# Patient Record
Sex: Female | Born: 1965 | Race: White | Hispanic: No | Marital: Married | Smoking: Current every day smoker
Health system: Southern US, Community
[De-identification: ages and names within clinical notes are randomized; demographics above are authoritative.]

## PROBLEM LIST (undated history)

## (undated) DIAGNOSIS — F1020 Alcohol dependence, uncomplicated: Secondary | ICD-10-CM

## (undated) DIAGNOSIS — D649 Anemia, unspecified: Secondary | ICD-10-CM

## (undated) DIAGNOSIS — F319 Bipolar disorder, unspecified: Secondary | ICD-10-CM

## (undated) DIAGNOSIS — N200 Calculus of kidney: Secondary | ICD-10-CM

## (undated) DIAGNOSIS — K5000 Crohn's disease of small intestine without complications: Secondary | ICD-10-CM

## (undated) DIAGNOSIS — E039 Hypothyroidism, unspecified: Secondary | ICD-10-CM

## (undated) DIAGNOSIS — S92902A Unspecified fracture of left foot, initial encounter for closed fracture: Secondary | ICD-10-CM

## (undated) DIAGNOSIS — F32A Depression, unspecified: Secondary | ICD-10-CM

## (undated) DIAGNOSIS — F329 Major depressive disorder, single episode, unspecified: Secondary | ICD-10-CM

## (undated) HISTORY — DX: Unspecified fracture of left foot, initial encounter for closed fracture: S92.902A

## (undated) HISTORY — DX: Alcohol dependence, uncomplicated: F10.20

## (undated) HISTORY — DX: Major depressive disorder, single episode, unspecified: F32.9

## (undated) HISTORY — DX: Depression, unspecified: F32.A

## (undated) HISTORY — PX: COLONOSCOPY: SHX174

## (undated) HISTORY — DX: Crohn's disease of small intestine without complications: K50.00

## (undated) HISTORY — PX: TONSILLECTOMY: SUR1361

## (undated) HISTORY — DX: Bipolar disorder, unspecified: F31.9

## (undated) HISTORY — PX: INTRAUTERINE DEVICE INSERTION: SHX323

## (undated) HISTORY — DX: Calculus of kidney: N20.0

## (undated) HISTORY — PX: FOOT SURGERY: SHX648

## (undated) HISTORY — DX: Anemia, unspecified: D64.9

## (undated) HISTORY — DX: Hypothyroidism, unspecified: E03.9

---

## 2009-11-27 DIAGNOSIS — K5000 Crohn's disease of small intestine without complications: Secondary | ICD-10-CM

## 2009-11-27 HISTORY — DX: Crohn's disease of small intestine without complications: K50.00

## 2010-05-30 ENCOUNTER — Inpatient Hospital Stay (HOSPITAL_COMMUNITY): Admission: EM | Admit: 2010-05-30 | Discharge: 2010-06-10 | Payer: Self-pay | Admitting: Orthopedic Surgery

## 2010-05-30 ENCOUNTER — Ambulatory Visit: Payer: Self-pay | Admitting: Internal Medicine

## 2010-05-30 ENCOUNTER — Ambulatory Visit: Payer: Self-pay | Admitting: Pulmonary Disease

## 2010-06-13 ENCOUNTER — Encounter (INDEPENDENT_AMBULATORY_CARE_PROVIDER_SITE_OTHER): Payer: Self-pay | Admitting: *Deleted

## 2010-06-13 ENCOUNTER — Telehealth: Payer: Self-pay | Admitting: Internal Medicine

## 2010-06-14 ENCOUNTER — Encounter: Payer: Self-pay | Admitting: Gastroenterology

## 2010-06-14 ENCOUNTER — Telehealth: Payer: Self-pay | Admitting: Internal Medicine

## 2010-06-14 ENCOUNTER — Ambulatory Visit: Payer: Self-pay | Admitting: Gastroenterology

## 2010-06-14 ENCOUNTER — Inpatient Hospital Stay (HOSPITAL_COMMUNITY): Admission: AD | Admit: 2010-06-14 | Discharge: 2010-06-24 | Payer: Self-pay | Admitting: Gastroenterology

## 2010-06-14 DIAGNOSIS — K5000 Crohn's disease of small intestine without complications: Secondary | ICD-10-CM | POA: Insufficient documentation

## 2010-06-15 ENCOUNTER — Other Ambulatory Visit: Payer: Self-pay | Admitting: Gastroenterology

## 2010-06-17 ENCOUNTER — Other Ambulatory Visit: Payer: Self-pay | Admitting: Gastroenterology

## 2010-06-17 ENCOUNTER — Other Ambulatory Visit: Payer: Self-pay | Admitting: General Surgery

## 2010-06-17 ENCOUNTER — Encounter (INDEPENDENT_AMBULATORY_CARE_PROVIDER_SITE_OTHER): Payer: Self-pay

## 2010-06-17 HISTORY — PX: HEMICOLECTOMY: SHX854

## 2010-06-18 ENCOUNTER — Other Ambulatory Visit: Payer: Self-pay | Admitting: Gastroenterology

## 2010-06-18 ENCOUNTER — Other Ambulatory Visit: Payer: Self-pay | Admitting: General Surgery

## 2010-06-19 ENCOUNTER — Other Ambulatory Visit: Payer: Self-pay | Admitting: Gastroenterology

## 2010-06-19 ENCOUNTER — Other Ambulatory Visit: Payer: Self-pay | Admitting: General Surgery

## 2010-06-20 ENCOUNTER — Other Ambulatory Visit: Payer: Self-pay | Admitting: General Surgery

## 2010-06-20 ENCOUNTER — Other Ambulatory Visit: Payer: Self-pay | Admitting: Gastroenterology

## 2010-06-21 ENCOUNTER — Other Ambulatory Visit: Payer: Self-pay | Admitting: General Surgery

## 2010-06-21 ENCOUNTER — Other Ambulatory Visit: Payer: Self-pay | Admitting: Gastroenterology

## 2010-06-22 ENCOUNTER — Other Ambulatory Visit: Payer: Self-pay | Admitting: General Surgery

## 2010-06-22 ENCOUNTER — Other Ambulatory Visit: Payer: Self-pay | Admitting: Gastroenterology

## 2010-06-23 ENCOUNTER — Other Ambulatory Visit: Payer: Self-pay | Admitting: Gastroenterology

## 2010-06-24 ENCOUNTER — Other Ambulatory Visit: Payer: Self-pay | Admitting: Gastroenterology

## 2010-06-27 ENCOUNTER — Telehealth: Payer: Self-pay | Admitting: Internal Medicine

## 2010-07-01 ENCOUNTER — Encounter: Payer: Self-pay | Admitting: Internal Medicine

## 2010-07-05 ENCOUNTER — Telehealth: Payer: Self-pay | Admitting: Internal Medicine

## 2010-07-07 ENCOUNTER — Encounter: Payer: Self-pay | Admitting: Internal Medicine

## 2010-07-20 ENCOUNTER — Encounter (INDEPENDENT_AMBULATORY_CARE_PROVIDER_SITE_OTHER): Payer: Self-pay | Admitting: *Deleted

## 2010-07-20 ENCOUNTER — Ambulatory Visit: Payer: Self-pay | Admitting: Internal Medicine

## 2010-07-20 DIAGNOSIS — E559 Vitamin D deficiency, unspecified: Secondary | ICD-10-CM | POA: Insufficient documentation

## 2010-07-20 LAB — CONVERTED CEMR LAB
Hep B S Ab: NEGATIVE
Hepatitis B Surface Ag: NEGATIVE
Mumps IgG: 3.41 — ABNORMAL HIGH
Rubella: 251.5 intl units/mL — ABNORMAL HIGH
Vit D, 25-Hydroxy: 74 ng/mL (ref 30–89)

## 2010-07-28 ENCOUNTER — Encounter: Payer: Self-pay | Admitting: Internal Medicine

## 2010-08-03 LAB — CONVERTED CEMR LAB
BUN: 15 mg/dL (ref 6–23)
Basophils Absolute: 0.1 10*3/uL (ref 0.0–0.1)
CO2: 28 meq/L (ref 19–32)
CRP, High Sensitivity: 9.48 — ABNORMAL HIGH (ref 0.00–5.00)
Chloride: 101 meq/L (ref 96–112)
Eosinophils Relative: 7 % — ABNORMAL HIGH (ref 0.0–5.0)
Glucose, Bld: 74 mg/dL (ref 70–99)
Potassium: 4.8 meq/L (ref 3.5–5.1)
RDW: 15.2 % — ABNORMAL HIGH (ref 11.5–14.6)
Sodium: 139 meq/L (ref 135–145)
Total Bilirubin: 0.4 mg/dL (ref 0.3–1.2)
Total Protein: 6.7 g/dL (ref 6.0–8.3)

## 2010-08-23 ENCOUNTER — Ambulatory Visit: Payer: Self-pay | Admitting: Internal Medicine

## 2010-09-02 ENCOUNTER — Telehealth (INDEPENDENT_AMBULATORY_CARE_PROVIDER_SITE_OTHER): Payer: Self-pay | Admitting: *Deleted

## 2010-09-05 ENCOUNTER — Telehealth: Payer: Self-pay | Admitting: Internal Medicine

## 2010-09-14 ENCOUNTER — Telehealth: Payer: Self-pay | Admitting: Internal Medicine

## 2010-09-14 ENCOUNTER — Ambulatory Visit (HOSPITAL_COMMUNITY): Admission: RE | Admit: 2010-09-14 | Discharge: 2010-09-14 | Payer: Self-pay | Admitting: Internal Medicine

## 2010-09-16 ENCOUNTER — Telehealth: Payer: Self-pay | Admitting: Internal Medicine

## 2010-10-09 ENCOUNTER — Encounter: Payer: Self-pay | Admitting: Internal Medicine

## 2010-10-14 ENCOUNTER — Ambulatory Visit: Payer: Self-pay | Admitting: Internal Medicine

## 2010-10-14 DIAGNOSIS — D649 Anemia, unspecified: Secondary | ICD-10-CM

## 2010-10-14 LAB — CONVERTED CEMR LAB
Basophils Relative: 1.2 % (ref 0.0–3.0)
Eosinophils Relative: 1.8 % (ref 0.0–5.0)
Ferritin: 162 ng/mL (ref 10.0–291.0)
Lymphocytes Relative: 34.9 % (ref 12.0–46.0)
Lymphs Abs: 2.4 10*3/uL (ref 0.7–4.0)
Monocytes Relative: 7.5 % (ref 3.0–12.0)
Neutro Abs: 3.8 10*3/uL (ref 1.4–7.7)
RDW: 15.1 % — ABNORMAL HIGH (ref 11.5–14.6)
Saturation Ratios: 24.6 % (ref 20.0–50.0)
Transferrin: 264.7 mg/dL (ref 212.0–360.0)
WBC: 6.9 10*3/uL (ref 4.5–10.5)

## 2010-10-16 ENCOUNTER — Telehealth: Payer: Self-pay | Admitting: Internal Medicine

## 2010-12-13 ENCOUNTER — Encounter: Payer: Self-pay | Admitting: Internal Medicine

## 2010-12-13 ENCOUNTER — Other Ambulatory Visit: Payer: Self-pay | Admitting: Internal Medicine

## 2010-12-13 ENCOUNTER — Ambulatory Visit
Admission: RE | Admit: 2010-12-13 | Discharge: 2010-12-13 | Payer: Self-pay | Source: Home / Self Care | Attending: Internal Medicine | Admitting: Internal Medicine

## 2010-12-13 DIAGNOSIS — F1020 Alcohol dependence, uncomplicated: Secondary | ICD-10-CM | POA: Insufficient documentation

## 2010-12-13 DIAGNOSIS — E039 Hypothyroidism, unspecified: Secondary | ICD-10-CM | POA: Insufficient documentation

## 2010-12-13 DIAGNOSIS — F319 Bipolar disorder, unspecified: Secondary | ICD-10-CM | POA: Insufficient documentation

## 2010-12-13 LAB — COMPREHENSIVE METABOLIC PANEL
ALT: 22 U/L (ref 0–35)
AST: 18 U/L (ref 0–37)
Albumin: 4.1 g/dL (ref 3.5–5.2)
Alkaline Phosphatase: 69 U/L (ref 39–117)
BUN: 12 mg/dL (ref 6–23)
CO2: 24 mEq/L (ref 19–32)
Calcium: 8.8 mg/dL (ref 8.4–10.5)
Chloride: 108 mEq/L (ref 96–112)
Creatinine, Ser: 1.1 mg/dL (ref 0.4–1.2)
GFR: 57.97 mL/min — ABNORMAL LOW (ref >60.00)
Glucose, Bld: 95 mg/dL (ref 70–99)
Potassium: 4 mEq/L (ref 3.5–5.1)
Sodium: 140 mEq/L (ref 135–145)
Total Bilirubin: 0.4 mg/dL (ref 0.3–1.2)
Total Protein: 6.9 g/dL (ref 6.0–8.3)

## 2010-12-13 LAB — CBC WITH DIFFERENTIAL/PLATELET
Basophils Absolute: 0 10*3/uL (ref 0.0–0.1)
Basophils Relative: 0.3 % (ref 0.0–3.0)
Eosinophils Absolute: 0.1 10*3/uL (ref 0.0–0.7)
Eosinophils Relative: 1.2 % (ref 0.0–5.0)
HCT: 35.1 % — ABNORMAL LOW (ref 36.0–46.0)
Hemoglobin: 12.1 g/dL (ref 12.0–15.0)
Lymphocytes Relative: 27.3 % (ref 12.0–46.0)
Lymphs Abs: 2.1 10*3/uL (ref 0.7–4.0)
MCHC: 34.5 g/dL (ref 30.0–36.0)
MCV: 95.1 fl (ref 78.0–100.0)
Monocytes Absolute: 0.5 10*3/uL (ref 0.1–1.0)
Monocytes Relative: 6.9 % (ref 3.0–12.0)
Neutro Abs: 4.9 10*3/uL (ref 1.4–7.7)
Neutrophils Relative %: 64.3 % (ref 43.0–77.0)
Platelets: 332 10*3/uL (ref 150.0–400.0)
RBC: 3.69 Mil/uL — ABNORMAL LOW (ref 3.87–5.11)
RDW: 13.8 % (ref 11.5–14.6)
WBC: 7.7 10*3/uL (ref 4.5–10.5)

## 2010-12-13 LAB — HIGH SENSITIVITY CRP: CRP, High Sensitivity: 1.44 mg/L (ref 0.00–5.00)

## 2010-12-14 ENCOUNTER — Encounter: Payer: Self-pay | Admitting: Internal Medicine

## 2010-12-16 ENCOUNTER — Telehealth (INDEPENDENT_AMBULATORY_CARE_PROVIDER_SITE_OTHER): Payer: Self-pay | Admitting: *Deleted

## 2010-12-17 ENCOUNTER — Encounter: Payer: Self-pay | Admitting: Internal Medicine

## 2010-12-17 ENCOUNTER — Telehealth: Payer: Self-pay | Admitting: Internal Medicine

## 2010-12-20 ENCOUNTER — Telehealth: Payer: Self-pay | Admitting: Internal Medicine

## 2010-12-27 NOTE — Consult Note (Signed)
Summary: Abdominal Pain    NAME:  Sheri Washington, Sheri Washington             ACCOUNT NO.:  [id number]      MEDICAL RECORD NO.:  [id number]          PATIENT TYPE:  INP      LOCATION:  2108                         FACILITY:  [facility]      PHYSICIAN:  Iva Boop, MD,FACGDATE OF BIRTH:  1966/07/04      DATE OF CONSULTATION:  05/30/2010   DATE OF DISCHARGE:                                    CONSULTATION      REQUESTING PHYSICIAN:  Coralyn Helling, MD      PRIMARY CARE PHYSICIAN:  [facility], [place], [place].      REASON FOR CONSULTATION:  Abdominal pain, abnormal ileum on CT scan.      ASSESSMENT:   12. A 45 year old white woman with an acute serious abdominal process       involving the ileum, inflammatory process with peritonitis.  She       has improved at this time but remains critically ill.  The etiology       of this is not clear.  I have reviewed the CT (unenhanced) findings from       [facility], Crohn's disease is suggested and is possible, but       the possibility of some other type of acute inflammatory process or       perhaps ischemic process is in the differential as well.   2. Anemia.  Hemoglobin 8, not a new problem.  She has seen a       hematologist in [place] and apparently had at least blood testing       with unclear etiology.  I do not think she had a bone marrow       biopsy, but we will recheck after the dictation.      RECOMMENDATIONS AND PLAN:   1. CT of the abdomen and pelvis with IV contrast to better understand       what is going on.  Depending upon the clinical course, she could       require a laparoscopy or laparotomy.  Surgery has seen her and       should follow up with her.   2. Continue Zosyn and metronidazole.   3. Further plans pending clinical course.      HISTORY:  This is a 45 year old white woman who has had some mild lower   abdominal pelvic pain off and on for 6 months.  Then approximately 24   hours prior to going to  [facility] (last night) developed   relatively acute right lower quadrant pain.  She then developed fever   and felt worse and presented to the hospital at [facility] and was   transferred here.  She was somewhat shocky.  She had a CT of the abdomen   and pelvis without contrast that showed some abnormal loops of bowel in   the pelvis consistent with inflammatory infectious process, slight   dilatation of the proximal ileum.  She has been given IV fluids and   antibiotics and pain medication, feels somewhat better.  Dr. Lindie Spruce saw   her and thought though she had some peritoneal findings that she did not   need an operation.  She has not been vomiting.  She has not had a bowel   movement.  There has been no bleeding.  Over the past 6 months, she was   diagnosed with hypothyroidism and started Synthroid replacement and felt   somewhat better.  She had been losing weight overall, though that was in   the setting of her father dying from pancreatic cancer.  She had some   occasional constipation but no diarrhea over time.      HOME MEDICATIONS:  Zyprexa, Depakote, Cymbalta, and Synthroid.      HOSPITAL MEDICATIONS:  Metronidazole, Zosyn, and Protonix and   intermittent morphine.      ALLERGIES:  None known.      FAMILY HISTORY:  Father with pancreatic cancer and her grandparent had   colon cancer.      PAST MEDICAL HISTORY:   1. Bipolar disorder.   2. Hypothyroidism.   3. The anemia mentioned above.      SOCIAL HISTORY:  No tobacco or alcohol.  Her mother is here right now.   She is married.      REVIEW OF SYSTEMS:  Last defecation was yesterday and was reported to be   normal.  She has had the problems as outlined in the HPI.  She has been   weak and feeling acutely ill as mentioned above.  She last smoked 2   months ago, but she has quit having had smoked 1 pack per day.   Menstrual periods had been mildly irregular, last menstrual period was   on February 28, 2010.       PHYSICAL EXAMINATION:  GENERAL:  An acutely ill white woman, in no acute   distress at this time.   VITAL SIGNS:  Temperature 101.2, pulse 135, blood pressure 104/63,   oxygen saturation on 2 liters 97%.   HEENT:  The eyes are anicteric.  Pink conjunctivae, slightly dry tongue,   otherwise clear oropharynx.   NECK:  Supple without mass.   LUNGS:  Clear with decreased inspiratory effort.   HEART:  Tachycardic S1 and S2.   ABDOMEN:  Mildly distended, very quiet.  I hear transmitted heart   sounds, maybe a few bowel sounds.  There is some tenderness with   jiggling the bed.  She has rebound tenderness.  She says the morphine   has dealt things somewhat.  She has more right greater than left lower   quadrant tenderness.  She is tender to percussion as well.   EXTREMITIES:  She has no lower extremity edema.   LYMPH NODES:  No cervical adenopathy.   NEUROLOGIC:  She is alert and oriented x3 and grossly nonfocal on   neurologic exam.      LABORATORY DATA:  Hemoglobin of 8 with a normal MCV, white count 8.8,   platelet count 280.  Lactic acid 1.3.  Glucose was 100, chloride 115,   albumin 1.8, calcium 6.4, bilirubin 0.2; otherwise, the CMET is normal.   Lactic acid was normal.  Stools were hemocculted and were positive.  At   [facility] prior to admission here, she had negative urinalysis.  She had   a negative urine pregnancy.  White blood cell count was 17,000 there,   hemoglobin 10.6, and left shift.  Creatinine was slightly high at 1.38.   Sodium was 130, CO2 was 18, with an  anion gap of 14.      Additional Note:  Sinus tachycardia on EKG at [facility].  Acute abdominal   series here, unremarkable bowel pattern.      I appreciate the opportunity to care for this patient.               Iva Boop, MD,FACG            CEG/MEDQ  D:  05/30/2010  T:  05/31/2010  Job:  [id number]      cc:   Coralyn Helling, MD   [facility], [place]      Electronically Signed by Stan Head MDFACG on 06/09/2010 09:12:38 AM

## 2010-12-27 NOTE — Letter (Signed)
Summary: [facility]   Imported By: Lester Baker 07/22/2010 09:47:52  _____________________________________________________________________  External Attachment:    Type:   Image     Comment:   External Document

## 2010-12-27 NOTE — Assessment & Plan Note (Signed)
Summary: post hospital crohn's/sheri   History of Present Illness Visit Type: Follow-up Visit Primary GI MD: Stan Head MD [facility] Primary Loree Shehata: Catheryn Bacon, PA Requesting Alpheus Stiff: na Chief Complaint: Crohn's History of Present Illness:   45 yo ww diagnosed with Crohn's dieae of the small bowel when she presented with abscesses and critical illness in July 2011. She eneded up with ileocecal resection, abscess drainage and separate small bowel resection  after failing medical therapy and percutaneous drainage. She has not yet been on any Crohn's therapy. She is feeling significantly better overall. She had been having months of anemia, fatigue, some abdominal pain and steadily worsened until her acute illness in July. She had some diarrhea after resection, helped by fiber supplementation per Dr. Johna Sheriff.  Other problems now are poor teeth, needs implants has not had no recent vit D level but restarted after refill came to see Dr. Johna Sheriff again in early September    GI Review of Systems      Denies abdominal pain, acid reflux, belching, bloating, chest pain, dysphagia with liquids, dysphagia with solids, heartburn, loss of appetite, nausea, vomiting, vomiting blood, weight loss, and  weight gain.        Denies anal fissure, black tarry stools, change in bowel habit, constipation, diarrhea, diverticulosis, fecal incontinence, heme positive stool, hemorrhoids, irritable bowel syndrome, jaundice, light color stool, liver problems, rectal bleeding, and  rectal pain.    Current Medications (verified): 1)  Ensure  Liqd (Nutritional Supplements) .Marland Kitchen.. 1 Can By Mouth Two Times A Day 2)  Biotin 10 Mg Tabs (Biotin) .... Take 1 Tablet By Mouth Once Daily 3)  Cymbalta 60 Mg Cpep (Duloxetine Hcl) .... Take 2 Capsules By Mouth Daily 4)  Depakote 500 Mg Tbec (Divalproex Sodium) .... Take 1 Tablet By Mouth Three Times A Day 5)  Nicotine 21 Mg/24hr Pt24 (Nicotine) .... As Directed 6)   Synthroid 25 Mcg Tabs (Levothyroxine Sodium) .... Once Daily 7)  Vitamin D 400 Unit Tabs (Cholecalciferol) .... Once Daily 8)  Zyprexa 20 Mg Tabs (Olanzapine) .... One Tablet By Mouth Once Daily  Allergies (verified): No Known Drug Allergies  Past History:  Past Medical History: Alcoholism Depression Anemia Bipolar Disorder Hypothyroidism Crohns Disease-Small Intestine   Past Surgical History: Reviewed history from 07/18/2010 and no changes required. Tonsillectomy Foot surgery Ileocecectomy w/anastomosis and seperate small bowel resection w/anastomosis  Family History: Family History of Colon Cancer:Paternal Grandparents (60's, 70's)Maternal Uncle (60) Family History of Pancreatic Cancer:Father  Social History: Unemployed Married 2 children Patient is a former smoker.  Alcohol Use - no Illicit Drug Use - no  Vital Signs:  Patient profile:   44 year old female Height:      66 inches Weight:      120 pounds BMI:     19.44 BSA:     1.61 Pulse rate:   64 / minute Pulse rhythm:   irregular BP sitting:   98 / 60  (left arm) Cuff size:   regular  Vitals Entered By: [name] CMA (July 20, 2010 2:12 PM)  Physical Exam  General:  Well developed, well nourished, no acute distress. Eyes:  anicteric Lungs:  Clear throughout to auscultation. Heart:  Regular rate and rhythm; no murmurs, rubs,  or bruits. Abdomen:  low midline scar soft and nontender w/ohsm/mass BS+ Rectal:  deferred until time of colonoscopy.   Extremities:  no edema   Impression & Recommendations:  Problem # 1:  CROHN'S DISEASE-SMALL INTESTINE (ICD-555.0) Assessment Improved Diagnosed in July  2012, presented with abscesses and critical illness. Ileocecal resction, abscess drainage, separate small bowel resection 06/18/11([facility]) Needs staging, then determine medication though suspect will need immunomodulators at least. vit D level check vaccine status low residue diet brief immunomod  talkTPMT phenotype  Orders: TPMT Enzyme ([employer] #3320) ([address]) Colonoscopy (Colon) TLB-CBC Platelet - w/Differential (85025-CBCD) TLB-CMP (Comprehensive Metabolic Pnl) (80053-COMP) TLB-CRP-High Sensitivity (C-Reactive Protein) (86140-FCRP) TLB-Sedimentation Rate (ESR) (85652-ESR) T- * Misc. Laboratory test [phone]) T- * Misc. Laboratory test [phone]) T- * Misc. Laboratory test [phone]) T-Vitamin D (25-Hydroxy) [phone]) T-Hepatitis B Surface Antibody [phone]) T-Hepatitis B Surface Antigen [phone]) T-Hepatitis A Antibody ([address]25-36[address])  Problem # 2:  VITAMIN D DEFICIENCY (ICD-268.9) Assessment: New dx elsewhere reassess may need DEXA  Patient Instructions: 1)  Please go to the basement to have your lab tests drawn today. 2)  You should have a tetanus booster and we will recommend other vaccines once blood test results in. 3)  [email or web address] is an excellent Crohn's website. 4)  Please pick up your medications at your pharmacy. MOVIPREP 5)  We will see you at your procedure on 08/23/10. 6)  [facility] Patient Information Guide given to patient.  7)  Colonoscopy and Flexible Sigmoidoscopy brochure given.  8)  Copy sent to : Glenna Fellows, MD, Catheryn Bacon, PA 9)  The medication list was reviewed and reconciled.  All changed / newly prescribed medications were explained.  A complete medication list was provided to the patient / caregiver. Prescriptions: MOVIPREP 100 GM  SOLR (PEG-KCL-NACL-NASULF-NA ASC-C) As per prep instructions.  #1 x 0   Entered by:   Francee Piccolo CMA (AAMA)   Authorized by:   Iva Boop MD, [facility]   Signed by:   Francee Piccolo CMA (AAMA) on 07/20/2010   Method used:   Electronically to        [facility] (retail)       [address]       [place], [place]  [id number]       Ph: [phone]       Fax: [phone]   RxID:   [id number]

## 2010-12-27 NOTE — Letter (Signed)
Summary: [facility]   Imported By: Lennie Odor 08/23/2010 15:18:00  _____________________________________________________________________  External Attachment:    Type:   Image     Comment:   External Document

## 2010-12-27 NOTE — Procedures (Signed)
Summary: Colonoscopy  Patient: Sheri Washington Note: All result statuses are Final unless otherwise noted.  Tests: (1) Colonoscopy (COL)   COL Colonoscopy           DONE     [facility]     [address]     [place], [place]  [address]           COLONOSCOPY PROCEDURE REPORT           PATIENT:  [name], [name]  MR#:  [id number]     BIRTHDATE:  09-24-1966, 44 yrs. old  GENDER:  female     ENDOSCOPIST:  Iva Boop, MD, [facility]           PROCEDURE DATE:  08/23/2010     PROCEDURE:  Colonoscopy with biopsy     ASA CLASS:  Class II     INDICATIONS:  Crohn's disease s/p ileo-cecal resection July 2011     (initial presentation with Crohn's ileitis, abscess and fistulae     MEDICATIONS:   Benadryl 25 mg IV, Fentanyl 75 mcg IV, Versed 8 mg     IV           DESCRIPTION OF PROCEDURE:   After the risks benefits and     alternatives of the procedure were thoroughly explained, informed     consent was obtained.  Digital rectal exam was performed and     revealed no abnormalities.   The [id number] endoscope was     introduced through the anus and advanced to the anastomosis,     without limitations.  The quality of the prep was Moviprep fair.     The instrument was then slowly withdrawn as the colon was fully     examined. Insertion: 10:08 minutes Withdrawal: 7:45 minutes     <<PROCEDUREIMAGES>>           FINDINGS:  The neo-terminal ileum appeared normal. Multiple     biopsies were obtained and sent to pathology.  There was a normal     ileo-colonic anastomosis identified.  It was difficult to see all     of the mucosa and small ulcers or inflammation could have been     missed.A normal appearing cecum, ileocecal valve, and appendiceal     orifice were identified. The ascending, hepatic flexure,     transverse, splenic flexure, descending, sigmoid colon, and rectum     appeared unremarkable.   Retroflexed views in the rectum revealed     no abnormalities.    The scope was  then withdrawn from the patient     and the procedure completed.           COMPLICATIONS:  None     ENDOSCOPIC IMPRESSION:     1) Normal neo-terminal ileum - biopsied     2) Normal ileo-colo anastomosis     3) Normal colon     RECOMMENDATIONS:     1) follow-up: GI Clinic 1 month(s)     Call Dr. Marvell Fuller office soon (this week) to schedule an     appointment for late October or early November. will discuss     therapy then.     Will notify re: today's biopsies and any other studies by phone     before then.     REPEAT EXAM:  In for Colonoscopy. January 2018 for routine cancer     screening. As needed otherwise.           Iva Boop,  MD, Clement[facility] Graham           CC:  Glenna Fellows, MD, Catheryn Bacon, PA-C, and The Patient                 n.     eSIGNED:   Iva Boop at 08/23/2010 02:35 PM           Emogene Morgan, [id number]  Note: An exclamation mark (!) indicates a result that was not dispersed into the flowsheet. Document Creation Date: 08/23/2010 2:36 PM _______________________________________________________________________  (1) Order result status: Final Collection or observation date-time: 08/23/2010 14:20 Requested date-time:  Receipt date-time:  Reported date-time:  Referring Physician:   Ordering Physician: Stan Head [phone]) Specimen Source:  Source: Launa Grill Order Number: [id number] Lab site:   Appended Document: Colonoscopy     Procedures Next Due Date:    Colonoscopy: 08/2017

## 2010-12-27 NOTE — Progress Notes (Signed)
Summary: Labs ok - set up rev in 2 months  Phone Note Outgoing Call   Summary of Call: Let her know that labs actually ok I would like to see her in 2 months to recheck and re-discuss Tx Iva Boop MD, [facility]  October 16, 2010 8:20 PM      Appended Document: Labs ok - set up rev in 2 months LM to [facility] at home number   Appended Document: Labs ok - set up rev in 2 months LM to [facility] at home number Francee Piccolo CMA Duncan Dull)  October 19, 2010 11:10 AM  Advised pt of lab results and plan.  Pt is scheduled for REV on 12/13/10 @ 10:45am Francee Piccolo CMA Duncan Dull)  October 19, 2010 11:20 AM

## 2010-12-27 NOTE — Progress Notes (Signed)
Summary: Wants to bring her here or to Emergency Room  Phone Note Call from Patient   Caller: Mary-Mom Call For: Dr Leone Payor Summary of Call: Fever of 101 last night and had diarrhea and wants to sleep alot.  Feels like she needs to take her to emergency room or bring her here to be seen Initial call taken by: Leanor Kail [facility],  June 14, 2010 8:09 AM  Follow-up for Phone Call        Patient  had diarrhea yesterday and fever 101.3.  Discussed with Dr Leone Payor patient to come in this am and see Mike Gip PA Follow-up by: Darcey Nora RN, CGRN,  June 14, 2010 8:32 AM

## 2010-12-27 NOTE — Progress Notes (Signed)
Summary: Triage  Phone Note Call from Patient Call back at Home Phone [phone]   Caller: Patient Call For: Dr. Leone Payor Reason for Call: Talk to Nurse Summary of Call: pt. is running a fever 102.0.Marland KitchenMarland KitchenMarland Kitchenhad dental work yesterday and does not know if it is related to dental work or her stomach Initial call taken by: Karna Christmas,  September 16, 2010 12:26 PM  Follow-up for Phone Call        patient had 2 molars pulled yesterday at her dentists office.  No new GI complaints.  I have asked her to contact her dentist about fever.  She is asked to call back for GI complaints or concerns. Follow-up by: Darcey Nora RN, CGRN,  September 16, 2010 1:38 PM  Additional Follow-up for Phone Call Additional follow up Details #1::        agree Additional Follow-up by: Iva Boop MD, [facility],  September 16, 2010 4:14 PM

## 2010-12-27 NOTE — Progress Notes (Signed)
Summary: Questions  Phone Note Call from Patient Call back at [phone]   Caller: Mom Call For: Sheri Washington Summary of Call: Temp of 99.6 this morning and her bp was 72/50 this morning. Mother has a couple of questions daughter is staying with her Initial call taken by: Harlow Mares CMA Duncan Dull),  June 13, 2010 9:43 AM  Follow-up for Phone Call        I spoke with the patient this am.  She states she feels well, she has no complaints, "infact feel pretty good".  She reports her mom is worried.  I have asked her if she felt like she needs to come in and be seen today and she declines.  She is tolerating a diet, has pain meds that she is taking as needed, no real pain now.  "I don't feel like I did when I had a fever", BP and temp were taken prior to her getting out of bed.  she got .  Currently takink Cipro 500 two times a day .  She has a follow up appointment with Sheri Washington for 06/24/10.  I have asked her to call me back if her temp increases, or her prvious symptoms return.  She is advised we will call back if Dr Sheri Washington has any further recommendations. Follow-up by: Darcey Nora RN, CGRN,  June 13, 2010 10:17 AM

## 2010-12-27 NOTE — Letter (Signed)
Summary: [facility] Instructions  [facility]  [address] [place], [place] [address]   Phone: [phone]  Fax: [phone]       Sheri Washington    [birth year]    MRN: [id number]      Procedure Day Dorna Bloom: Jake Shark, 08/23/10     Arrival Time: 12:30 PM      Procedure Time: 1:30 PM    Location of Procedure:                    _X_  [facility] (4th Floor)  PREPARATION FOR COLONOSCOPY WITH MOVIPREP   Starting 5 days prior to your procedure 08/18/10 do not eat nuts, seeds, popcorn, corn, beans, peas,  salads, or any raw vegetables.  Do not take any fiber supplements (e.g. Metamucil, Citrucel, and Benefiber).  THE DAY BEFORE YOUR PROCEDURE         MONDAY, 08/22/10  1.  Drink clear liquids the entire day-NO SOLID FOOD  2.  Do not drink anything colored red or purple.  Avoid juices with pulp.  No orange juice.  3.  Drink at least 64 oz. (8 glasses) of fluid/clear liquids during the day to prevent dehydration and help the prep work efficiently.  CLEAR LIQUIDS INCLUDE: Water Jello Ice Popsicles Tea (sugar ok, no milk/cream) Powdered fruit flavored drinks Coffee (sugar ok, no milk/cream) Gatorade Juice: apple, white grape, white cranberry  Lemonade Clear bullion, consomm, broth Carbonated beverages (any kind) Strained chicken noodle soup Hard Candy                           4.  In the morning, mix first dose of MoviPrep solution:    Empty 1 Pouch A and 1 Pouch B into the disposable container    Add lukewarm drinking water to the top line of the container. Mix to dissolve    Refrigerate (mixed solution should be used within 24 hrs)  5.  Begin drinking the prep at 5:00 p.m. The MoviPrep container is divided by 4 marks.   Every 15 minutes drink the solution down to the next mark (approximately 8 oz) until the full liter is complete.   6.  Follow completed prep with 16 oz of clear liquid of your choice (Nothing red or purple).  Continue to drink clear  liquids until bedtime.  7.  Before going to bed, mix second dose of MoviPrep solution:    Empty 1 Pouch A and 1 Pouch B into the disposable container    Add lukewarm drinking water to the top line of the container. Mix to dissolve    Refrigerate  THE DAY OF YOUR PROCEDURE      TUESDAY, 08/23/10  Beginning at 8:30 AM (5 hours before procedure):         1. Every 15 minutes, drink the solution down to the next mark (approx 8 oz) until the full liter is complete.  2. Follow completed prep with 16 oz. of clear liquid of your choice.    3. You may drink clear liquids until 11:30 AM (2 HOURS BEFORE PROCEDURE).  MEDICATION INSTRUCTIONS  Unless otherwise instructed, you should take regular prescription medications with a small sip of water   as early as possible the morning of your procedure.       OTHER INSTRUCTIONS  You will need a responsible adult at least 44 years of age to accompany you and drive you home.   This  person must remain in the waiting room during your procedure.  Wear loose fitting clothing that is easily removed.  Leave jewelry and other valuables at home.  However, you may wish to bring a book to read or  an iPod/MP3 player to listen to music as you wait for your procedure to start.  Remove all body piercing jewelry and leave at home.  Total time from sign-in until discharge is approximately 2-3 hours.  You should go home directly after your procedure and rest.  You can resume normal activities the  day after your procedure.  The day of your procedure you should not:   Drive   Make legal decisions   Operate machinery   Drink alcohol   Return to work  You will receive specific instructions about eating, activities and medications before you leave.    The above instructions have been reviewed and explained to me by   Francee Piccolo, CMA (AAMA)    I fully understand and can verbalize these instructions _____________________________ Date  _________

## 2010-12-27 NOTE — Initial Assessments (Signed)
Summary: fever, diarrhea/crohn's/sheri   History of Present Illness Visit Type: Follow-up Visit Primary GI MD: Stan Head MD [facility] Primary Provider: Catheryn Bacon, PA Chief Complaint: Post hospital, Crohn's flare with diarrhea & fever History of Present Illness:   PLEASANT 45 Y.O. FEMALE KNOWN RECENTLY TO DR. Leone Payor WHEN SEEN DURING HOSPITALIZATION. SHE HAS A NEW DX OF CROHNS DISEASE AND  A 10 DAY HOSPITAL STAY 7/4-7/15/11 [facility] PRESENTED  WITH ACUTE LOWER ABDOMINAL PAIN AND FEVER WITH HX OF MILDER LOWER ABDOMINAL PAIN X 6 MONTHS. ON CT WAS FOUND TO HAVE MARKED INFLAMMATORY CHANGES OF THE  TERMINAL ILEUM,2 ABSCESSES,AND AT LEAST ONE FISTULA SMALL BOWEL TO SMALL BOWEL. SHE HAD PERCUTANEOUS DRAINAGE TO THE LARGER ABSCESS. FOLLOW UP CT SEVERAL DAYS LATER SHOWED RESOLUTION.CATHETER INJECTION SHOWED NO COMMUNICATION WITH FISTUAL AND CATHER WAS REMOVED.THE SECOND ABSCESS WAS SMALLER AT 1.3 CM. SHE HAD SURGICAL CONSULTATION WITH DR. Sheryn Bison DEFINITE PLAN FOR RESECTION. SHE WAS STARTED ON PENTASA BUT NO IMMUNOSUPPRESSANTS BECAUSE OF THE ABSCESSES. SHE WAS DISCHARGED ON 7/15 ON CIPRO X 2 WEEKS.SHE FELT OK FOR ONE DAY AT HOME THEN STARTED WITH INCREASED DIARRHEA. YESTERDAY HAD MULTIPLE WATERY STOOLS AND WAS INCONTINENT. SHE HAD LOW GRADE FEVERS OVER THE WEEKEND AND LAST NIGHT TEMP 101,WITH SWEATS.SHE HAS ONGOING RIGHT LOWER ABDOMINAL PAIN, NO N/V,C/O WEAKNESS.   GI Review of Systems    Reports abdominal pain, bloating, and  loss of appetite.     Location of  Abdominal pain: RLQ.    Denies acid reflux, belching, chest pain, dysphagia with liquids, dysphagia with solids, heartburn, nausea, vomiting, vomiting blood, and  weight loss.      Reports change in bowel habits and  diarrhea.     Denies anal fissure, black tarry stools, diverticulosis, fecal incontinence, heme positive stool, hemorrhoids, irritable bowel syndrome, jaundice, light color stool, liver problems, rectal bleeding, and  rectal  pain. Preventive Screening-Counseling & Management  Alcohol-Tobacco     Smoking Status: quit      Drug Use:  no.      Current Medications (verified): 1)  Cipro 500 Mg Tabs (Ciprofloxacin Hcl) .... Take 1 Tablet By Mouth Two Times A Day For 14 Days 2)  Ensure  Liqd (Nutritional Supplements) .Marland Kitchen.. 1 Can By Mouth Three Times A Day 3)  Pentasa 250 Mg Cr-Caps (Mesalamine) .... Take 1 Tablet By Mouth Four Times A Day 4)  Oxycontin 30 Mg Xr12h-Tab (Oxycodone Hcl) .... Take 1 Tablet By Mouth Two Times A Day For 21 Days 5)  Vicodin 5-500 Mg Tabs (Hydrocodone-Acetaminophen) .... Take 1 Tablet By Mouth Every 6 Hours As Needed For Breakthrough Pain 6)  Biotin 10 Mg Tabs (Biotin) .... Take 1 Tablet By Mouth Once Daily 7)  Cymbalta 60 Mg Cpep (Duloxetine Hcl) .... Take 2 Capsules By Mouth Daily 8)  Depakote 500 Mg Tbec (Divalproex Sodium) .... Take 1 Tablet By Mouth Three Times A Day 9)  Nicotine 21 Mg/24hr Pt24 (Nicotine) .... As Directed 10)  Synthroid 25 Mcg Tabs (Levothyroxine Sodium) .... Once Daily 11)  Slow Fe 160 (50 Fe) Mg Cr-Tabs (Ferrous Sulfate Dried) .... Once Daily 12)  Vitamin D 400 Unit Tabs (Cholecalciferol) .... Once Daily  Allergies (verified): No Known Drug Allergies  Past History:  Past Medical History: Alcoholism Depression  Past Surgical History: Tonsillectomy Foot surgery  Family History: Family History of Colon Cancer:Paternal Grandparents, Uncle Family History of Pancreatic Cancer:Father  Social History: Patient is a former smoker.  Alcohol Use - no Illicit Drug Use - no Smoking Status:  quit Drug Use:  no  Review of Systems       The patient complains of allergy/sinus, anxiety-new, change in vision, depression-new, fatigue, and muscle pains/cramps.  The patient denies anemia, arthritis/joint pain, back pain, blood in urine, breast changes/lumps, confusion, cough, coughing up blood, fainting, fever, headaches-new, hearing problems, heart murmur, heart  rhythm changes, itching, menstrual pain, night sweats, nosebleeds, pregnancy symptoms, shortness of breath, skin rash, sleeping problems, sore throat, swelling of feet/legs, swollen lymph glands, thirst - excessive , urination - excessive , urination changes/pain, urine leakage, vision changes, and voice change.         ROS OTHERWISE AS IN HPI  Vital Signs:  Patient profile:   45 year old female Height:      66 inches Weight:      128.50 pounds BMI:     20.82 Temp:     98.6 degrees F oral Pulse rate:   68 / minute Pulse rhythm:   irregular BP sitting:   70 / 50  (right arm) Cuff size:   regular  Vitals Entered By: June McMurray CMA Duncan Dull) (June 14, 2010 10:48 AM)  Physical Exam  General:  Well developed, well nourished, no acute distress.FATIGUED APPEARING Head:  Normocephalic and atraumatic. Eyes:  PERRLA, no icterus. Neck:  Supple; no masses or thyromegaly. Lungs:  Clear throughout to auscultation. Heart:  Regular rate and rhythm; no murmurs, rubs,  or bruits. Abdomen:  SOFT, TENDER ACROSS LOWER ABDOMEN,RIGHT LOWER ABDOMEN FULL AND VERY TENDER, NO REBOUND,BS+,NO MASS OR HSM Rectal:  NOT DONE Extremities:  No clubbing, cyanosis, edema or deformities noted. Neurologic:  Alert and  oriented x4;  grossly normal neurologically. Psych:  Alert and cooperative. Normal mood and affect.   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 NOW S/P DRAINAGE OF LARGER ABSCESS WITH RECURRENT FEVER,DIAPHORESIS,RLQ PAIN AND PROGRESSIVE DIARRHEA.  SUSPECT RECURRENT OR ENLARGING ABSCESSES. ALSO CONSIDER C.DIFF  SUPERIMPOSED ON SEVERE IBD.  RE-ADMIT TO HOSPITAL REPEAT CT SCAN SURGICAL CONSULT- DO NOT FEEL SHE WILL BE ABLE TO BE MANAGED MEDICALLY  AND ULTIMATELY WILL NEED ILEAL RESECTION COVER  WITH IV ZOSYN COVER  WITH ORAL FLAGYL PENDING STOOL CULTURES ETC. PAIN CONTROL SEE ORDERS  Problem # 2:  HYPOTENSION Assessment: New SECONDARY TO ABOVE  Problem # 3:  DEPRESSION (ICD-311) Assessment: Comment Only

## 2010-12-27 NOTE — Assessment & Plan Note (Signed)
Summary: COLON F-UP/YF   History of Present Illness Visit Type: Follow-up Visit Primary GI MD: Stan Head MD [facility] Primary Boyde Grieco: Oris Drone, NP and Linna Darner, MD  Requesting Lillianna Sabel: na Chief Complaint: lower abd pain  History of Present Illness:   45 yo ww with initial diagnosis of Crohn's disease this summer when she presented with  abscesses and ended up with an ileo-colonic resection. symptoms now are iIntermittent abdominal cramps relieved by dicyclomine. Mild constipation in the last week, helped by metamucil. Here with mom again. She had slowed down and perhaps stopped smoking but has relapsed. Mother provides some history.   GI Review of Systems    Reports abdominal pain.     Location of  Abdominal pain: lower abdomen.    Denies acid reflux, belching, bloating, chest pain, dysphagia with liquids, dysphagia with solids, heartburn, loss of appetite, nausea, vomiting, vomiting blood, weight loss, and  weight gain.        Denies anal fissure, black tarry stools, change in bowel habit, constipation, diarrhea, diverticulosis, fecal incontinence, heme positive stool, hemorrhoids, irritable bowel syndrome, jaundice, light color stool, liver problems, rectal bleeding, and  rectal pain.    Colonoscopy  Procedure date:  08/23/2010  Findings:          1) Normal neo-terminal ileum - biopsied     2) Normal ileo-colo anastomosis     3) Normal colon   1. Ileum, biopsy, distal :  - FOCAL ACTIVE ILEITIS WITH ULCERATION, CONISTENT WITH INFLAMMATORY BOWEL DISEASE. - THERE IS NO EVIDENCE OF , DYSPLASIA, OR MALIGNANCY. - SEE COMMENT.  MRI EXAM  Procedure date:  09/14/2010  Findings:      MR Enterography  1.  Moderately degraded exam secondary to factors detailed above.   2.  Given this factor, no convincing evidence of residual Crohn's disease or its complications. 3.  Given the patient's claustrophobia and the poor quality on the current exam, CT  enterography should be considered for followup. If there are strong symptoms to suggest acute disease, further evaluation with CTE should be considered. 4.  Probable constipation.     Current Medications (verified): 1)  Biotin 10 Mg Tabs (Biotin) .... Take 1 Tablet By Mouth Once Daily 2)  Cymbalta 60 Mg Cpep (Duloxetine Hcl) .... Take 2 Capsules By Mouth Daily 3)  Depakote 500 Mg Tbec (Divalproex Sodium) .... Take 1 Tablet By Mouth Three Times A Day 4)  Nicotine 21 Mg/24hr Pt24 (Nicotine) .... As Directed 5)  Synthroid 50 Mcg Tabs (Levothyroxine Sodium) .... One Tablet By Mouth Once Daily 6)  Vitamin D 400 Unit Tabs (Cholecalciferol) .... Once Daily 7)  Zyprexa 20 Mg Tabs (Olanzapine) .... One Tablet By Mouth Once Daily 8)  Dicyclomine Hcl 20 Mg  Tabs (Dicyclomine Hcl) .Marland Kitchen.. 1 By Mouth Q 6 Hours As Needed For Abdominal Pain 9)  Lorazepam 2 Mg Tabs (Lorazepam) .... Take 1 Tablet By Mouth 30 Minutes Before Procedure  Allergies (verified): No Known Drug Allergies  Past History:  Past Medical History: Alcoholism Depression Anemia Bipolar Disorder Hypothyroidism Crohns Disease-Small Intestine  VITAMIN D DEFICIENCY (ICD-268.9)  Past Surgical History: Reviewed history from 07/18/2010 and no changes required. Tonsillectomy Foot surgery Ileocecectomy w/anastomosis and seperate small bowel resection w/anastomosis  Family History: Reviewed history from 07/20/2010 and no changes required. Family History of Colon Cancer:Paternal Grandparents (60's, 70's)Maternal Uncle (86) Family History of Pancreatic Cancer:Father  Social History: Unemployed Married 2 children Alcohol Use - no Illicit Drug Use - no Patient currently smokes:  Smoking Status:  current  Review of Systems       as per HPI  Vital Signs:  Patient profile:   45 year old female Height:      66 inches Weight:      141 pounds BMI:     22.84 BSA:     1.73 Pulse rate:   62 / minute Pulse rhythm:   irregular BP  sitting:   98 / 64  (left arm) Cuff size:   regular  Vitals Entered By: [name] CMA (October 14, 2010 11:29 AM)  Physical Exam  General:  Well developed, well nourished, no acute distress. Eyes:  anicteric  Lungs:  clear ant Heart:  Regular rate and rhythm; no murmurs, rubs,  or bruits. 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 Ileocecal resction, abscess drainage, separate small bowel resection 06/18/11([facility]) Colonoscopy 9/11 no visible disease but micro diseaase on ileal bx MR-E negative (somewha limited) 10/11 mild sxs now, not on therapy but responds to dicyclomine She will think about immunomodulator therapy (TPMT phenotype is normal). she is naive to hepatitis B and should have other vaccines, including Pneumoavax. will discuss/plan later. would also do annual PPD. she is reluctant to go on therapy now though I have explained that she has microscopic changes and will have recurrent macroscopic disease at some point in the next few years if not sooner. She presented with aggressive disease which increases her chances of early recurrence and has microscopic disease now.  Problem # 2:  ANEMIA (ICD-285.9) Assessment: Unchanged  Orders: TLB-CBC Platelet - w/Differential (85025-CBCD) TLB-B12 + Folate Pnl (16109_60454-U98/JXB) TLB-Ferritin (82728-FER) TLB-IBC Pnl (Iron/FE;Transferrin) (83550-IBC)  She will eventually need B12 supplements. Start within 6 months.  Patient Instructions: 1)  Please go to the basement to have your lab tests drawn today.  2)  We will call you with further follow up after reviewing these results. 3)  Please pick up your medications at your pharmacy. DICYCLOMINE 4)  Copy sent to : Linna Darner, MD; Glenna Fellows, MD 5)  The medication list was reviewed and reconciled.  All changed / newly prescribed medications were explained.  A complete medication list was provided to the patient / caregiver. Patient: Sheri Washington Note: All result statuses are Final unless otherwise noted.  Tests: (1) CBC Platelet w/Diff (CBCD)   White Cell Count          6.9 K/uL                    4.5-10.5   Red Cell Count       [L]  3.81 Mil/uL                 3.87-5.11   Hemoglobin                12.4 g/dL                   14.7-82.9   Hematocrit                36.2 %                      36.0-46.0   MCV  94.8 fl                     78.0-100.0   MCHC                      34.3 g/dL                   14.7-82.9   RDW                  [H]  15.1 %                      11.5-14.6   Platelet Count            311.0 K/uL                  150.0-400.0   Neutrophil %              54.6 %                      43.0-77.0   Lymphocyte %              34.9 %                      12.0-46.0   Monocyte %                7.5 %                       3.0-12.0   Eosinophils%              1.8 %                       0.0-5.0   Basophils %               1.2 %                       0.0-3.0   Neutrophill Absolute      3.8 K/uL                    1.4-7.7   Lymphocyte Absolute       2.4 K/uL                    0.7-4.0   Monocyte Absolute         0.5 K/uL                    0.1-1.0  Eosinophils, Absolute                             0.1 K/uL                    0.0-0.7   Basophils Absolute        0.1 K/uL                    0.0-0.1  Tests: (2) B12 + Folate Panel (B12/FOL)   Vitamin B12               330 pg/mL                   211-911   Folate  5.7 ng/mL     Deficient  0.4 - 3.4 ng/mL     Indeterminate  3.4 - 5.4 ng/mL     Normal  >5.4 ng/mL  Tests: (3) Ferritin (FER)   Ferritin                  162.0 ng/mL                 10.0-291.0  Tests: (4) IBC Panel (IBC)   Iron                       91 ug/dL                    66-440   Transferrin               264.7 mg/dL                 347.4-259.5   Iron Saturation           24.6 %                      20.0-50.0

## 2010-12-27 NOTE — Progress Notes (Signed)
Summary: RX Error  ---- Converted from flag ---- ---- 09/01/2010 4:58 PM, Francee Piccolo CMA (AAMA) wrote: Failed NewRx: Pharmacy is not active.;  Validation Error. Dr Name: Iva Boop;  Pharmacy: [facility].*;  Medication: DICYCLOMINE HCL 20 MG  TABS;  Instructions: 1 by mouth q 6 hours as needed for abdominal pain;  Quantity: 90;  Refills: 0;  Entry Date: [id number];  Transaction ID: [id number];  Prescription ID: [id number];   ------------------------------  Phone Note From Pharmacy   Summary of Call: RX resent by fax to pharmacy. Initial call taken by: Francee Piccolo CMA Duncan Dull),  September 02, 2010 8:31 AM    New/Updated Medications: DICYCLOMINE HCL 20 MG  TABS (DICYCLOMINE HCL) 1 by mouth q 6 hours as needed for abdominal pain Prescriptions: DICYCLOMINE HCL 20 MG  TABS (DICYCLOMINE HCL) 1 by mouth q 6 hours as needed for abdominal pain  #90 x 0   Entered by:   Francee Piccolo CMA (AAMA)   Authorized by:   Iva Boop MD, [facility]   Signed by:   Francee Piccolo CMA (AAMA) on 09/02/2010   Method used:   Faxed to ...       [facility] (retail)       [address]       [place], [place]  [id number]       Ph: [phone]       Fax: [phone]   RxID:   [id number]   Appended Document: RX Error PC from pt requesting med to be called to a different pharmacy.  Rx never received at [facility] [address] [facility] [address] branch.  RX sent to [facility] location.    Clinical Lists Changes   Prescriptions: DICYCLOMINE HCL 20 MG  TABS (DICYCLOMINE HCL) 1 by mouth q 6 hours as needed for abdominal pain  #90 x 0   Entered by:   Francee Piccolo CMA (AAMA)   Authorized by:   Iva Boop MD, [facility]   Signed by:   Francee Piccolo CMA (AAMA) on 09/02/2010   Method used:   Faxed to ...       [facility]* (retail)       [address]       [place], [place]  [address]       Ph: [phone]       Fax:  [phone]   RxID:   [id number]

## 2010-12-27 NOTE — Letter (Signed)
Summary: [facility]   Imported By: Lester Webster 07/22/2010 09:49:21  _____________________________________________________________________  External Attachment:    Type:   Image     Comment:   External Document

## 2010-12-27 NOTE — Letter (Signed)
Summary: New Patient letter  [facility]  [address] [place], [place] [address]   Phone: [phone]  Fax: [phone]       06/13/2010 MRN: [id number]  [facility] [address] [place], [place]  [address]  Dear Ms. Collington,  Welcome to the Gastroenterology Division at [facility]    You are scheduled to see Dr.  Leone Payor  on 06/24/2010 at 1:45pm on the 3rd floor at [facility], [address]  We ask that you try to arrive at our office 15 minutes prior to your appointment time to allow for check-in.  We would like you to complete the enclosed self-administered evaluation form prior to your visit and bring it with you on the day of your appointment.  We will review it with you.  Also, please bring a complete list of all your medications or, if you prefer, bring the medication bottles and we will list them.  Please bring your insurance card so that we may make a copy of it.  If your insurance requires a referral to see a specialist, please bring your referral form from your primary care physician.  Co-payments are due at the time of your visit and may be paid by cash, check or credit card.     Your office visit will consist of a consult with your physician (includes a physical exam), any laboratory testing he/she may order, scheduling of any necessary diagnostic testing (e.g. x-ray, ultrasound, CT-scan), and scheduling of a procedure (e.g. Endoscopy, Colonoscopy) if required.  Please allow enough time on your schedule to allow for any/all of these possibilities.    If you cannot keep your appointment, please call [phone] to cancel or reschedule prior to your appointment date.  This allows [place] the opportunity to schedule an appointment for another patient in need of care.  If you do not cancel or reschedule by 5 p.m. the business day prior to your appointment date, you will be charged a $50.00 late cancellation/no-show fee.    Thank you for choosing  [facility] for your medical needs.  We appreciate the opportunity to care for you.  Please visit [place] at our website  to learn more about our practice.                     Sincerely,                                                             The Gastroenterology Division

## 2010-12-27 NOTE — Progress Notes (Signed)
Summary: Needs f-up appt  Phone Note Call from Patient   Caller: cell [phone] Fidela Salisbury mother Call For: Dr Leone Payor Summary of Call: Needs a 3wk fu with Dr Leone Payor for hosp fu. Initial call taken by: Leanor Kail [facility],  June 27, 2010 1:05 PM  Follow-up for Phone Call        rev scheduled for 07/20/10 2:15 Follow-up by: Darcey Nora RN, CGRN,  June 27, 2010 1:40 PM

## 2010-12-27 NOTE — Progress Notes (Signed)
Summary: MRI tomorrow  Phone Note Call from Patient Call back at Home Phone [phone]   Call For: Dr Arlyce Dice Reason for Call: Talk to Nurse Summary of Call: Having MRI tomorrow and is clausterphobic- wonders if something can be called in for her to help. Initial call taken by: Leanor Kail [facility],  September 05, 2010 12:56 PM  Follow-up for Phone Call        Advised pt we will call her with advice on 09/06/10. Dr. Leone Payor, Please advise. Follow-up by: Francee Piccolo CMA Duncan Dull),  September 05, 2010 4:45 PM  Additional Follow-up for Phone Call Additional follow up Details #1::        can we switch it to open MRI? that is prbably best option Additional Follow-up by: Iva Boop MD, [facility],  September 05, 2010 5:03 PM    Additional Follow-up for Phone Call Additional follow up Details #2::    Advised pt that Dr. Leone Payor would prefer not to prescribe any additional meds, and we have checked [facility] for Open MRI appt.  There are no appt's for open machine until next week. Pt states she will keep today's appt.  Recommended pt take ear phones and see if she can listen to music while having her procedure. Follow-up by: Francee Piccolo CMA Duncan Dull),  September 06, 2010 8:24 AM  Additional Follow-up for Phone Call Additional follow up Details #3:: Details for Additional Follow-up Action Taken: I may have miscommunicated - sorry, hoped for open MR but Lorazepam 2 mg by mouth x 1 30 minutes before procedure is ok and may send that Rx let me know what i need to do Additional Follow-up by: Iva Boop MD, [facility],  September 06, 2010 9:32 AM   Appended Document: MRI tomorrow    Phone Note Outgoing Call   Summary of Call: LM to [facility] at home number if pt still desires Lorazepam prior to procedure. Initial call taken by: Francee Piccolo CMA Duncan Dull),  September 06, 2010 10:24 AM  Follow-up for Phone Call        pt notified and rx sent. Follow-up by: Francee Piccolo CMA Duncan Dull),   September 06, 2010 10:33 AM    New/Updated Medications: LORAZEPAM 2 MG TABS (LORAZEPAM) take 1 tablet by mouth 30 minutes before procedure Prescriptions: LORAZEPAM 2 MG TABS (LORAZEPAM) take 1 tablet by mouth 30 minutes before procedure  #1 x 0   Entered by:   Francee Piccolo CMA (AAMA)   Authorized by:   Iva Boop MD, [facility]   Signed by:   Francee Piccolo CMA (AAMA) on 09/06/2010   Method used:   Printed then faxed to ...       [facility] (retail)       [address]       [place], [place]  [id number]       Ph: [phone]       Fax: [phone]   RxID:   [id number]    Appended Document: MRI tomorrow Reprint with correct pharmacy info.

## 2010-12-27 NOTE — Progress Notes (Signed)
Summary: MR-E results  Phone Note Outgoing Call   Summary of Call: let her know MR-E is ok will see her in Nov Iva Boop MD, [facility]  September 14, 2010 3:09 PM   Follow-up for Phone Call        LM to [facility] at home number Francee Piccolo CMA Duncan Dull)  September 15, 2010 9:45 AM   notified of above.  pt is agreeable.   Follow-up by: Francee Piccolo CMA Duncan Dull),  September 15, 2010 10:18 AM

## 2010-12-27 NOTE — Progress Notes (Signed)
Summary: Triage  Phone Note Call from Patient   Caller: [phone] [facility] Call For: DR Leone Payor Summary of Call: Lost weight is nauseus and doesnt really want to eat. Initial call taken by: Leanor Kail [facility],  July 05, 2010 9:33 AM  Follow-up for Phone Call        Pt. was D/C'd from hospital 06-24-10,for abd. surgery & new dx. Crohn's. Saw Dr.Hoxworth on 06-25-10, was doing well. Currently she is pale, weak, shakey, had vomiting on Saturday and has a low grade fever X2 days. Pt's mother advised to call her surgeon ASAP. Pt. to keep scheduled office visit w/Dr.Gessner on 07-20-10. Pt. instructed to call back as needed.  Follow-up by: Laureen Ochs LPN,  July 05, 2010 9:41 AM  Additional Follow-up for Phone Call Additional follow up Details #1::        correct to follow-up with surgeon. Additional Follow-up by: Iva Boop MD, [facility],  July 05, 2010 3:09 PM

## 2010-12-29 NOTE — Letter (Signed)
Summary: *Referral Letter  [facility]  [address] [place], [place] [address]   Phone: [phone]  Fax: [phone]    12/13/2010 Dr. Norval Gable Thank you in advance for agreeing to see our patient:  Sheri Washington [address] [place], [place]  [address]  Phone: [phone]  Reason for Referral: she needs a PPD and also should have a Pneumovax in anticipation of starting immunosuppressive therapy for Crohn's disease. 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)   Current Medications: 1)  BIOTIN 10 MG TABS (BIOTIN) Take 1 tablet by mouth once daily 2)  CYMBALTA 60 MG CPEP (DULOXETINE HCL) Take 2 capsules by mouth daily 3)  DEPAKOTE 500 MG TBEC (DIVALPROEX SODIUM) Take 1 tablet by mouth three times a day 4)  SYNTHROID 50 MCG TABS (LEVOTHYROXINE SODIUM) one tablet by mouth once daily 5)  ZYPREXA 20 MG TABS (OLANZAPINE) one tablet by mouth once daily 6)  DICYCLOMINE HCL 20 MG  TABS (DICYCLOMINE HCL) 1 by mouth q 6 hours as needed for abdominal pain 7)  TOPAMAX 50 MG TABS (TOPIRAMATE) one tablet by mouth two times a day   Past Medical History: 1)  Alcoholism 2)  Depression 3)  Anemia 4)  Bipolar Disorder 5)  Hypothyroidism 6)  Crohns Disease-Small Intestine  7)  VITAMIN D DEFICIENCY (ICD-268.9)    Thank you again for agreeing to see our patient; please contact us if you have any further questions or need additional information.  Sincerely,  Iva Boop MD, [facility]

## 2010-12-29 NOTE — Progress Notes (Signed)
  Phone Note Other Incoming   Request: Send information Summary of Call: Records received from [facility]. 2 pages forwarded to Dr. Leone Payor for review.

## 2010-12-29 NOTE — Assessment & Plan Note (Signed)
Summary: 2 MONTH FOLLOW UP//SP   History of Present Illness Visit Type: Follow-up Visit Primary GI MD: Stan Head MD [facility] Primary Provider: Oris Drone, NP and Linna Darner, MD  Requesting Provider: na Chief Complaint: Crohn's History of Present Illness:   Rare lower abdominal cramps. She will use dicyclomine with relief. This is usually an after supper issue. Some constipation at times helped by MiraLax, used if she does not move her bowels in a few days, with success. This started after Christmas. She is now on Topamax but that was started after constipaton. Mom is here and participates in interview.     GI Review of Systems      Denies abdominal pain, acid reflux, belching, bloating, chest pain, dysphagia with liquids, dysphagia with solids, heartburn, loss of appetite, nausea, vomiting, vomiting blood, weight loss, and  weight gain.        Denies anal fissure, black tarry stools, change in bowel habit, constipation, diarrhea, diverticulosis, fecal incontinence, heme positive stool, hemorrhoids, irritable bowel syndrome, jaundice, light color stool, liver problems, rectal bleeding, and  rectal pain.    Current Medications (verified): 1)  Biotin 10 Mg Tabs (Biotin) .... Take 1 Tablet By Mouth Once Daily 2)  Cymbalta 60 Mg Cpep (Duloxetine Hcl) .... Take 2 Capsules By Mouth Daily 3)  Depakote 500 Mg Tbec (Divalproex Sodium) .... Take 1 Tablet By Mouth Three Times A Day 4)  Synthroid 50 Mcg Tabs (Levothyroxine Sodium) .... One Tablet By Mouth Once Daily 5)  Zyprexa 20 Mg Tabs (Olanzapine) .... One Tablet By Mouth Once Daily 6)  Dicyclomine Hcl 20 Mg  Tabs (Dicyclomine Hcl) .Marland Kitchen.. 1 By Mouth Q 6 Hours As Needed For Abdominal Pain 7)  Topamax 50 Mg Tabs (Topiramate) .... One Tablet By Mouth Two Times A Day 8)  Miralax  Powd (Polyethylene Glycol 3350) .... 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 Tonsillectomy Foot surgery Ileocecectomy w/anastomosis and seperate small bowel resection w/anastomosis  Family History: Reviewed history from 07/20/2010 and no changes required. Family History of Colon Cancer:Paternal Grandparents (60's, 70's)Maternal Uncle (63) Family History of Pancreatic Cancer:Father  Social History: Reviewed history from 10/14/2010 and no changes required. Unemployed Married 2 children Alcohol Use - no Illicit Drug Use - no Patient currently smokes:   Vital Signs:  Patient profile:   45 year old female Height:      66 inches Weight:      151 pounds BMI:     24.46 BSA:     1.78 Pulse rate:   96 / minute Pulse rhythm:   regular BP sitting:   128 / 80  (left arm) Cuff size:   regular  Vitals Entered By: [name] CMA (December 13, 2010 10:48 AM)  Physical Exam  General:  Well developed, well nourished, no acute distress. 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 She had been ill and anemic for 6+ months prior. Ileocecal resction, abscess drainage, separate small bowel resection 06/18/11([facility]) Colonoscopy 9/11 no visible disease but micro diseaase on ileal bx MR-E negative (somewhat  limited) 10/11 mild sxs now, not on therapy but responds to dicyclomine She is willing to try immunomodulator therapy to reduce recurrence risk now(TPMT phenotype is normal). She presented with aggressive disease which increases her chances of early  recurrence and has microscopic disease now.  she is willing to start , I  reviewed all meds again and no significant intereractions with - I have again reviewed rationale along with possible side effects (mom here also) and possibility of needeing antiTNF at some point PPD, Hepatitis B vaccine, flu shot, Pneumovax needed (PPD, Pneumovax at PCP)  may need DEXA at some point Orders: TLB-CBC Platelet - w/Differential (85025-CBCD) TLB-CMP (Comprehensive Metabolic Pnl) (80053-COMP) TLB-CRP-High Sensitivity (C-Reactive Protein) (86140-FCRP)  Will start at 75 mg daily (1.1mg /kg) once we have PPD results (not required but would like to do before she staryts immunomodulator which could alter results)  Problem # 2:  LONG-TERM (CURRENT) USE OF OTHER MEDICATIONS (ICD-V58.69) Assessment: New preparing to start HBV vaccine initiated here, PPD and Pneumovax will do at PCP influenza vaccine today once she starts will advise re: sun exposure, need for annual PAP smear  Patient Instructions: 1)  Note from Dr. Leone Payor for you to have PPD and possilbe Pneumo Vaccine at PCP and have them send [place] results. 2)  Hep B vaccine #1 and Flu vaccine given to patient today.  3)  Hep B  #2 will be scheduled 01/16/11. 4)  Crohn's and colitis information packet given to patient to read and review.   5)  Copy sent to : Oris Drone, NP and Linna Darner, MD  6)  The medication list was reviewed and reconciled.  All changed / newly prescribed medications were explained.  A complete medication list was provided to the patient / caregiver.   Hepatitis B Vaccine # 1    Vaccine Type: State HepB Ped/Adol    Site: right deltoid    Mfr: Merck    Dose: 1.0 ml    Route: IM    Given by: [name] CMA    Exp. Date: 08/05/2012    Lot #: [id number]    VIS given: 06/13/06 version given December 13, 2010.  Influenza Vaccine    Vaccine Type: fluvirin    Site: left deltoid    Mfr: norvartis    Dose: 0.5  ml    Route: IM    Given by: Milford Cage NCMA    Exp. Date: 05/17/2011    Lot #: [id number]    VIS given: 06/21/10 version given December 13, 2010.  Flu Vaccine Consent Questions    Do you have a history of severe allergic reactions to this vaccine? no    Any prior history of allergic reactions to egg and/or gelatin? no    Do you have a sensitivity to the preservative Thimersol? no    Do you have a past history of Guillan-Barre Syndrome? no    Do you currently have an acute febrile illness? no    Have you ever had a severe reaction to latex? no    Vaccine information given and explained to patient? yes    Are you currently pregnant? no   Patient: Sheri Washington Note: All result statuses are Final unless otherwise noted.  Tests: (1) CBC Platelet w/Diff (CBCD)   White Cell Count          7.7 K/uL                    4.5-10.5  Red Cell Count       [L]  3.69 Mil/uL                 3.87-5.11   Hemoglobin                12.1 g/dL                   05.3-97.6   Hematocrit           [L]  35.1 %                      36.0-46.0   MCV                       95.1 fl                     78.0-100.0   MCHC                      34.5 g/dL                   73.4-19.3   RDW                       13.8 %                      11.5-14.6   Platelet Count            332.0 K/uL                  150.0-400.0   Neutrophil %              64.3 %                      43.0-77.0   Lymphocyte %              27.3 %                      12.0-46.0   Monocyte %                6.9 %                       3.0-12.0   Eosinophils%              1.2 %                       0.0-5.0   Basophils %               0.3 %                       0.0-3.0   Neutrophill Absolute      4.9 K/uL                    1.4-7.7   Lymphocyte Absolute       2.1 K/uL                    0.7-4.0   Monocyte Absolute         0.5 K/uL                    0.1-1.0  Eosinophils, Absolute  0.1 K/uL                    0.0-0.7    Basophils Absolute        0.0 K/uL                    0.0-0.1  Tests: (2) CMP (COMP)   Sodium                    140 mEq/L                   135-145   Potassium                 4.0 mEq/L                   3.5-5.1   Chloride                  108 mEq/L                   96-112   Carbon Dioxide            24 mEq/L                    19-32   Glucose                   95 mg/dL                    45-40   BUN                       12 mg/dL                    9-81   Creatinine                1.1 mg/dL                   1.9-1.4   Total Bilirubin           0.4 mg/dL                   7.8-2.9   Alkaline Phosphatase      69 U/L                      39-117   AST                       18 U/L                      0-37   ALT                       22 U/L                      0-35   Total Protein             6.9 g/dL                    5.6-2.1   Albumin                   4.1 g/dL                    3.0-8.6  Calcium                   8.8 mg/dL                   1.6-10.9   GFR                  [L]  57.97 mL/min                >60.00  Tests: (3) Full Range CRP (FCRP)   CRPH                      1.44 mg/L                   0.00-5.00

## 2010-12-29 NOTE — Progress Notes (Signed)
Summary: start  Phone Note Outgoing Call   Summary of Call: PPD is negative (not starting anti-TNF but could need so was done) she has had Pneumovax (papers in folder) please update flowsheets she is 68 kg will start at 75 mg once daily (1.1 mg/kg) and will need a CBC and LFT's in 2 weeks (can arrange at PCP if possible) Iva Boop MD, [facility]  December 19, 2010 1:47 PM  Iva Boop MD, [facility]  December 17, 2010 11:16 AM    Follow-up for Phone Call        Flow sheets updated.  I have faxed an order to Dr Jarold Motto office.  Patient will go there for labs in 2 weeks. Follow-up by: Darcey Nora RN, CGRN,  December 19, 2010 2:55 PM    New/Updated Medications: MERCAPTOPURINE 50 MG TABS (MERCAPTOPURINE) 1 1/2 tablets daily Prescriptions: MERCAPTOPURINE 50 MG TABS (MERCAPTOPURINE) 1 1/2 tablets daily  #45 x 3   Entered by:   Darcey Nora RN, CGRN   Authorized by:   Iva Boop MD, [facility]   Signed by:   Darcey Nora RN, CGRN on 12/19/2010   Method used:   Electronically to        [facility] (retail)       [address]       [place], [place]  [id number]       Ph: [phone]       Fax: [phone]   RxID:   [id number]

## 2010-12-29 NOTE — Progress Notes (Signed)
Summary: Medication  Phone Note Call from Patient Call back at Home Phone [phone]   Caller: Patient Call For: Dr. Leone Payor Reason for Call: Talk to Nurse Summary of Call: Needs MERCAPTOPURINE sent to [facility] in [facility]  Initial call taken by: Karna Christmas,  December 20, 2010 12:57 PM  Follow-up for Phone Call        Cancelled RX at the other pharmacy and notified patient that we sent RX to pharmacy in [place].     Prescriptions: MERCAPTOPURINE 50 MG TABS (MERCAPTOPURINE) 1 1/2 tablets daily  #45 x 3   Entered by:   [name] CMA   Authorized by:   Iva Boop MD, [facility]   Signed by:   [name] CMA on 12/20/2010   Method used:   Electronically to        [facility] (retail)       [address]       [place], [place]  [address]       Ph: [phone]       Fax: [phone]   RxID:   [id number]

## 2010-12-29 NOTE — Medication Information (Signed)
Summary: Controlled med alert/[employer]  Controlled med alert/[employer]   Imported By: Lester Dublin 11/10/2010 11:09:19  _____________________________________________________________________  External Attachment:    Type:   Image     Comment:   External Document

## 2011-01-02 ENCOUNTER — Encounter: Payer: Self-pay | Admitting: Internal Medicine

## 2011-01-02 LAB — CONVERTED CEMR LAB
ALT: 131 units/L — AB
Hemoglobin: 12.3 g/dL
Platelets: 332 10*3/uL
Total Bilirubin: 0.5 mg/dL
WBC: 8 10*3/uL

## 2011-01-03 ENCOUNTER — Encounter: Payer: Self-pay | Admitting: Internal Medicine

## 2011-01-03 ENCOUNTER — Telehealth: Payer: Self-pay | Admitting: Internal Medicine

## 2011-01-04 NOTE — Miscellaneous (Signed)
Summary: TB & Pneumovax/[facility]  TB & Pneumovax/[facility]   Imported By: Sherian Rein 12/26/2010 08:15:14  _____________________________________________________________________  External Attachment:    Type:   Image     Comment:   External Document

## 2011-01-09 ENCOUNTER — Encounter: Payer: Self-pay | Admitting: Internal Medicine

## 2011-01-09 LAB — CONVERTED CEMR LAB
ALT: 185 units/L — AB
AST: 61 units/L — AB
Alkaline Phosphatase: 65 units/L
Total Bilirubin: 0.4 mg/dL

## 2011-01-12 NOTE — Progress Notes (Signed)
Summary: abnormal LFT's on  Phone Note Outgoing Call   Summary of Call: 01/02/11 labs reviewed AST is 61 and ALT 131, CBC and LFT's otherwise ok - elevated enzymes may be from but unless other problems would not stop it at this point she needs to check the LFT's again on 2/13 please - she may be able to do at PCP again dx. 790.4 and v58.69 please ask her if she is feeling bad at all Iva Boop MD, [facility]  January 03, 2011 4:00 PM   Follow-up for Phone Call        Left message for patient to call back Darcey Nora RN, [facility]  January 04, 2011 10:41 AM  patient has had a 1 week hx of vomiting and nausea.  has had 3 episodes of vomiting in the last week.  Otherwise she states she feels fine.  She will get her labs repeated at Dr [facility] office I have faxed an order to [phone]. patient advised her lab appointment is scheduled for 3:00 with his office for lab draw. Follow-up by: Darcey Nora RN, CGRN,  January 04, 2011 2:09 PM

## 2011-01-14 ENCOUNTER — Telehealth: Payer: Self-pay | Admitting: Internal Medicine

## 2011-01-14 ENCOUNTER — Encounter: Payer: Self-pay | Admitting: Internal Medicine

## 2011-01-16 ENCOUNTER — Encounter: Payer: Self-pay | Admitting: Internal Medicine

## 2011-01-16 ENCOUNTER — Encounter (INDEPENDENT_AMBULATORY_CARE_PROVIDER_SITE_OTHER): Payer: BC Managed Care – PPO

## 2011-01-16 DIAGNOSIS — K5000 Crohn's disease of small intestine without complications: Secondary | ICD-10-CM

## 2011-01-16 DIAGNOSIS — Z23 Encounter for immunization: Secondary | ICD-10-CM

## 2011-01-24 NOTE — Assessment & Plan Note (Signed)
Summary: #2 HEP B VACC...BS  Nurse Visit   Allergies: No Known Drug Allergies  Immunizations Administered:  Hepatitis B Vaccine # 2:    Vaccine Type: HepB Adolescent    Site: left deltoid    Mfr: Merck    Dose: 1.0 ml    Route: IM    Given by: Chales Abrahams CMA (AAMA)    Exp. Date: 02/20/2013    Lot #: [id number]    VIS given: 06/13/06 version given January 16, 2011. 2 doses of the adolescent vaccine given to equal 1.0 ml.   pt is schedueling # 3 at the front desk for 6 months from 12/13/10.  Orders Added: 1)  Hepatitis B Vaccine ADOLESCENT (2 dose) [90743] 2)  Admin 1st Vaccine [[address]]

## 2011-01-24 NOTE — Progress Notes (Addendum)
Summary: hold , LFT's up  Phone Note Outgoing Call   Summary of Call: AST is 61 and ALT 185 these are rising suggesting problems with hold and recheck LFT's in 2 weeks Iva Boop MD, [facility]  January 14, 2011 10:10 AM   Follow-up for Phone Call        patient advised I have faxed an order to Dr Jarold Motto office .  She will have labs drawn on 01/30/11.  She is advised to hold 6 MP until further notice. Follow-up by: Darcey Nora RN, CGRN,  January 16, 2011 9:37 AM    New/Updated Medications: MERCAPTOPURINE 50 MG TABS (MERCAPTOPURINE) 1 1/2 tablets daily*******HOLD*********  Appended Document: hold , LFT's up I have left a voicemail that patient is to have lab work this week at Dr Google

## 2011-02-03 ENCOUNTER — Encounter: Payer: Self-pay | Admitting: Internal Medicine

## 2011-02-03 LAB — CONVERTED CEMR LAB
AST: 15 units/L
Total Bilirubin: 0.4 mg/dL

## 2011-02-11 LAB — ANAEROBIC CULTURE

## 2011-02-11 LAB — CROSSMATCH
ABO/RH(D): O POS
ABO/RH(D): O POS
Antibody Screen: NEGATIVE
Antibody Screen: NEGATIVE

## 2011-02-11 LAB — GLUCOSE, CAPILLARY
Glucose-Capillary: 106 mg/dL — ABNORMAL HIGH (ref 70–99)
Glucose-Capillary: 108 mg/dL — ABNORMAL HIGH (ref 70–99)
Glucose-Capillary: 117 mg/dL — ABNORMAL HIGH (ref 70–99)
Glucose-Capillary: 130 mg/dL — ABNORMAL HIGH (ref 70–99)
Glucose-Capillary: 145 mg/dL — ABNORMAL HIGH (ref 70–99)
Glucose-Capillary: 84 mg/dL (ref 70–99)
Glucose-Capillary: 91 mg/dL (ref 70–99)
Glucose-Capillary: 93 mg/dL (ref 70–99)
Glucose-Capillary: 95 mg/dL (ref 70–99)
Glucose-Capillary: 97 mg/dL (ref 70–99)
Glucose-Capillary: 97 mg/dL (ref 70–99)

## 2011-02-11 LAB — BASIC METABOLIC PANEL
BUN: 2 mg/dL — ABNORMAL LOW (ref 6–23)
BUN: <1 mg/dL — ABNORMAL LOW (ref 6–23)
CO2: 24 mEq/L (ref 19–32)
CO2: 24 mEq/L (ref 19–32)
CO2: 26 mEq/L (ref 19–32)
CO2: 26 mEq/L (ref 19–32)
Calcium: 7.7 mg/dL — ABNORMAL LOW (ref 8.4–10.5)
Calcium: 7.9 mg/dL — ABNORMAL LOW (ref 8.4–10.5)
Chloride: 104 mEq/L (ref 96–112)
Chloride: 104 mEq/L (ref 96–112)
Chloride: 106 mEq/L (ref 96–112)
Creatinine, Ser: 0.63 mg/dL (ref 0.4–1.2)
Creatinine, Ser: 0.69 mg/dL (ref 0.4–1.2)
GFR calc Af Amer: >60 mL/min (ref >60)
GFR calc Af Amer: >60 mL/min (ref >60)
GFR calc Af Amer: >60 mL/min (ref >60)
GFR calc non Af Amer: >60 mL/min (ref >60)
GFR calc non Af Amer: >60 mL/min (ref >60)
GFR calc non Af Amer: >60 mL/min (ref >60)
Glucose, Bld: 104 mg/dL — ABNORMAL HIGH (ref 70–99)
Glucose, Bld: 125 mg/dL — ABNORMAL HIGH (ref 70–99)
Glucose, Bld: 88 mg/dL (ref 70–99)
Potassium: 3.4 mEq/L — ABNORMAL LOW (ref 3.5–5.1)
Potassium: 3.7 mEq/L (ref 3.5–5.1)
Potassium: 3.8 mEq/L (ref 3.5–5.1)
Potassium: 4.3 mEq/L (ref 3.5–5.1)
Sodium: 139 mEq/L (ref 135–145)
Sodium: 141 mEq/L (ref 135–145)
Sodium: 142 mEq/L (ref 135–145)

## 2011-02-11 LAB — POCT I-STAT 4, (NA,K, GLUC, HGB,HCT)
Glucose, Bld: 75 mg/dL (ref 70–99)
HCT: 35 % — ABNORMAL LOW (ref 36.0–46.0)
Hemoglobin: 11.9 g/dL — ABNORMAL LOW (ref 12.0–15.0)
Potassium: 4 mEq/L (ref 3.5–5.1)
Potassium: 4.3 mEq/L (ref 3.5–5.1)
Sodium: 140 mEq/L (ref 135–145)

## 2011-02-11 LAB — CBC
HCT: 28.6 % — ABNORMAL LOW (ref 36.0–46.0)
HCT: 30.2 % — ABNORMAL LOW (ref 36.0–46.0)
Hemoglobin: 10 g/dL — ABNORMAL LOW (ref 12.0–15.0)
Hemoglobin: 12 g/dL (ref 12.0–15.0)
Hemoglobin: 7.9 g/dL — ABNORMAL LOW (ref 12.0–15.0)
MCH: 29.6 pg (ref 26.0–34.0)
MCH: 30.1 pg (ref 26.0–34.0)
MCHC: 32.5 g/dL (ref 30.0–36.0)
MCHC: 33.2 g/dL (ref 30.0–36.0)
MCV: 89.6 fL (ref 78.0–100.0)
MCV: 90.3 fL (ref 78.0–100.0)
MCV: 90.6 fL (ref 78.0–100.0)
Platelets: 316 10*3/uL (ref 150–400)
Platelets: 390 10*3/uL (ref 150–400)
RBC: 2.72 MIL/uL — ABNORMAL LOW (ref 3.87–5.11)
RBC: 3.35 MIL/uL — ABNORMAL LOW (ref 3.87–5.11)
RBC: 4 MIL/uL (ref 3.87–5.11)
RDW: 15.1 % (ref 11.5–15.5)
RDW: 15.4 % (ref 11.5–15.5)
RDW: 15.5 % (ref 11.5–15.5)
WBC: 13.6 10*3/uL — ABNORMAL HIGH (ref 4.0–10.5)
WBC: 6.1 10*3/uL (ref 4.0–10.5)
WBC: 8.9 10*3/uL (ref 4.0–10.5)

## 2011-02-11 LAB — COMPREHENSIVE METABOLIC PANEL
ALT: 8 U/L (ref 0–35)
AST: 10 U/L (ref 0–37)
Calcium: 8.3 mg/dL — ABNORMAL LOW (ref 8.4–10.5)
Creatinine, Ser: 0.74 mg/dL (ref 0.4–1.2)
GFR calc Af Amer: >60 mL/min (ref >60)
GFR calc non Af Amer: >60 mL/min (ref >60)
Sodium: 134 mEq/L — ABNORMAL LOW (ref 135–145)
Total Bilirubin: 0.5 mg/dL (ref 0.3–1.2)
Total Protein: 5.7 g/dL — ABNORMAL LOW (ref 6.0–8.3)

## 2011-02-11 LAB — CULTURE, ROUTINE-ABSCESS: Culture: NO GROWTH

## 2011-02-11 LAB — CLOSTRIDIUM DIFFICILE EIA

## 2011-02-11 LAB — DIFFERENTIAL
Basophils Absolute: 0 10*3/uL (ref 0.0–0.1)
Basophils Relative: 1 % (ref 0–1)
Eosinophils Absolute: 0.2 10*3/uL (ref 0.0–0.7)
Eosinophils Relative: 3 % (ref 0–5)
Lymphocytes Relative: 28 % (ref 12–46)
Monocytes Absolute: 0.6 10*3/uL (ref 0.1–1.0)
Monocytes Relative: 11 % (ref 3–12)

## 2011-02-12 LAB — COMPREHENSIVE METABOLIC PANEL
ALT: 10 U/L (ref 0–35)
ALT: 9 U/L (ref 0–35)
ALT: 9 U/L (ref 0–35)
ALT: <8 U/L (ref 0–35)
ALT: <8 U/L (ref 0–35)
AST: 11 U/L (ref 0–37)
AST: 15 U/L (ref 0–37)
AST: 15 U/L (ref 0–37)
Albumin: 1.5 g/dL — ABNORMAL LOW (ref 3.5–5.2)
Albumin: 1.6 g/dL — ABNORMAL LOW (ref 3.5–5.2)
Albumin: 1.9 g/dL — ABNORMAL LOW (ref 3.5–5.2)
Alkaline Phosphatase: 38 U/L — ABNORMAL LOW (ref 39–117)
Alkaline Phosphatase: 49 U/L (ref 39–117)
Alkaline Phosphatase: 49 U/L (ref 39–117)
Alkaline Phosphatase: 72 U/L (ref 39–117)
Alkaline Phosphatase: 77 U/L (ref 39–117)
BUN: 1 mg/dL — ABNORMAL LOW (ref 6–23)
BUN: 10 mg/dL (ref 6–23)
BUN: 5 mg/dL — ABNORMAL LOW (ref 6–23)
BUN: 6 mg/dL (ref 6–23)
BUN: 8 mg/dL (ref 6–23)
CO2: 20 mEq/L (ref 19–32)
CO2: 23 mEq/L (ref 19–32)
CO2: 24 mEq/L (ref 19–32)
CO2: 24 mEq/L (ref 19–32)
Calcium: 6.6 mg/dL — ABNORMAL LOW (ref 8.4–10.5)
Calcium: 7 mg/dL — ABNORMAL LOW (ref 8.4–10.5)
Calcium: 7.4 mg/dL — ABNORMAL LOW (ref 8.4–10.5)
Chloride: 105 mEq/L (ref 96–112)
Chloride: 105 mEq/L (ref 96–112)
Chloride: 108 mEq/L (ref 96–112)
Chloride: 113 mEq/L — ABNORMAL HIGH (ref 96–112)
Creatinine, Ser: 0.47 mg/dL (ref 0.4–1.2)
GFR calc Af Amer: >60 mL/min (ref >60)
GFR calc Af Amer: >60 mL/min (ref >60)
GFR calc Af Amer: >60 mL/min (ref >60)
GFR calc non Af Amer: >60 mL/min (ref >60)
GFR calc non Af Amer: >60 mL/min (ref >60)
GFR calc non Af Amer: >60 mL/min (ref >60)
GFR calc non Af Amer: >60 mL/min (ref >60)
Glucose, Bld: 100 mg/dL — ABNORMAL HIGH (ref 70–99)
Glucose, Bld: 77 mg/dL (ref 70–99)
Potassium: 3.1 mEq/L — ABNORMAL LOW (ref 3.5–5.1)
Potassium: 3.2 mEq/L — ABNORMAL LOW (ref 3.5–5.1)
Potassium: 3.2 mEq/L — ABNORMAL LOW (ref 3.5–5.1)
Potassium: 3.9 mEq/L (ref 3.5–5.1)
Potassium: 3.9 mEq/L (ref 3.5–5.1)
Potassium: 4.1 mEq/L (ref 3.5–5.1)
Sodium: 136 mEq/L (ref 135–145)
Sodium: 138 mEq/L (ref 135–145)
Sodium: 139 mEq/L (ref 135–145)
Sodium: 140 mEq/L (ref 135–145)
Total Bilirubin: 0.1 mg/dL — ABNORMAL LOW (ref 0.3–1.2)
Total Bilirubin: 0.1 mg/dL — ABNORMAL LOW (ref 0.3–1.2)
Total Bilirubin: 0.3 mg/dL (ref 0.3–1.2)
Total Bilirubin: 0.5 mg/dL (ref 0.3–1.2)
Total Bilirubin: 0.5 mg/dL (ref 0.3–1.2)
Total Protein: 4.1 g/dL — ABNORMAL LOW (ref 6.0–8.3)
Total Protein: 4.4 g/dL — ABNORMAL LOW (ref 6.0–8.3)
Total Protein: 4.4 g/dL — ABNORMAL LOW (ref 6.0–8.3)
Total Protein: 6.3 g/dL (ref 6.0–8.3)

## 2011-02-12 LAB — CBC
HCT: 24.1 % — ABNORMAL LOW (ref 36.0–46.0)
HCT: 24.3 % — ABNORMAL LOW (ref 36.0–46.0)
HCT: 25.3 % — ABNORMAL LOW (ref 36.0–46.0)
Hemoglobin: 7.8 g/dL — ABNORMAL LOW (ref 12.0–15.0)
Hemoglobin: 8 g/dL — ABNORMAL LOW (ref 12.0–15.0)
Hemoglobin: 8 g/dL — ABNORMAL LOW (ref 12.0–15.0)
Hemoglobin: 8.4 g/dL — ABNORMAL LOW (ref 12.0–15.0)
Hemoglobin: 8.4 g/dL — ABNORMAL LOW (ref 12.0–15.0)
MCH: 29.5 pg (ref 26.0–34.0)
MCH: 29.5 pg (ref 26.0–34.0)
MCH: 29.6 pg (ref 26.0–34.0)
MCH: 30.1 pg (ref 26.0–34.0)
MCHC: 32.8 g/dL (ref 30.0–36.0)
MCHC: 32.9 g/dL (ref 30.0–36.0)
MCHC: 33.2 g/dL (ref 30.0–36.0)
MCV: 89.3 fL (ref 78.0–100.0)
MCV: 89.4 fL (ref 78.0–100.0)
MCV: 89.6 fL (ref 78.0–100.0)
MCV: 90.7 fL (ref 78.0–100.0)
MCV: 90.9 fL (ref 78.0–100.0)
MCV: 91.2 fL (ref 78.0–100.0)
Platelets: 278 10*3/uL (ref 150–400)
Platelets: 295 10*3/uL (ref 150–400)
Platelets: 321 10*3/uL (ref 150–400)
Platelets: 441 10*3/uL — ABNORMAL HIGH (ref 150–400)
Platelets: 462 10*3/uL — ABNORMAL HIGH (ref 150–400)
RBC: 2.63 MIL/uL — ABNORMAL LOW (ref 3.87–5.11)
RBC: 2.66 MIL/uL — ABNORMAL LOW (ref 3.87–5.11)
RBC: 2.7 MIL/uL — ABNORMAL LOW (ref 3.87–5.11)
RBC: 2.72 MIL/uL — ABNORMAL LOW (ref 3.87–5.11)
RBC: 2.83 MIL/uL — ABNORMAL LOW (ref 3.87–5.11)
RBC: 2.84 MIL/uL — ABNORMAL LOW (ref 3.87–5.11)
RBC: 2.88 MIL/uL — ABNORMAL LOW (ref 3.87–5.11)
RDW: 13.9 % (ref 11.5–15.5)
RDW: 14.1 % (ref 11.5–15.5)
RDW: 14.1 % (ref 11.5–15.5)
RDW: 15.1 % (ref 11.5–15.5)
WBC: 10 10*3/uL (ref 4.0–10.5)
WBC: 10.6 10*3/uL — ABNORMAL HIGH (ref 4.0–10.5)
WBC: 10.6 10*3/uL — ABNORMAL HIGH (ref 4.0–10.5)
WBC: 11.1 10*3/uL — ABNORMAL HIGH (ref 4.0–10.5)
WBC: 12.2 10*3/uL — ABNORMAL HIGH (ref 4.0–10.5)
WBC: 8.8 10*3/uL (ref 4.0–10.5)
WBC: 9.5 10*3/uL (ref 4.0–10.5)
WBC: 9.8 10*3/uL (ref 4.0–10.5)

## 2011-02-12 LAB — MAGNESIUM
Magnesium: 1.9 mg/dL (ref 1.5–2.5)
Magnesium: 2 mg/dL (ref 1.5–2.5)
Magnesium: 2.2 mg/dL (ref 1.5–2.5)
Magnesium: 2.3 mg/dL (ref 1.5–2.5)

## 2011-02-12 LAB — PREALBUMIN: Prealbumin: 7.4 mg/dL — ABNORMAL LOW (ref 18.0–45.0)

## 2011-02-12 LAB — GLUCOSE, CAPILLARY
Glucose-Capillary: 111 mg/dL — ABNORMAL HIGH (ref 70–99)
Glucose-Capillary: 112 mg/dL — ABNORMAL HIGH (ref 70–99)
Glucose-Capillary: 116 mg/dL — ABNORMAL HIGH (ref 70–99)
Glucose-Capillary: 122 mg/dL — ABNORMAL HIGH (ref 70–99)
Glucose-Capillary: 123 mg/dL — ABNORMAL HIGH (ref 70–99)
Glucose-Capillary: 126 mg/dL — ABNORMAL HIGH (ref 70–99)
Glucose-Capillary: 131 mg/dL — ABNORMAL HIGH (ref 70–99)
Glucose-Capillary: 134 mg/dL — ABNORMAL HIGH (ref 70–99)
Glucose-Capillary: 141 mg/dL — ABNORMAL HIGH (ref 70–99)
Glucose-Capillary: 144 mg/dL — ABNORMAL HIGH (ref 70–99)
Glucose-Capillary: 152 mg/dL — ABNORMAL HIGH (ref 70–99)
Glucose-Capillary: 158 mg/dL — ABNORMAL HIGH (ref 70–99)
Glucose-Capillary: 160 mg/dL — ABNORMAL HIGH (ref 70–99)
Glucose-Capillary: 164 mg/dL — ABNORMAL HIGH (ref 70–99)
Glucose-Capillary: 182 mg/dL — ABNORMAL HIGH (ref 70–99)
Glucose-Capillary: 80 mg/dL (ref 70–99)

## 2011-02-12 LAB — BASIC METABOLIC PANEL
BUN: 6 mg/dL (ref 6–23)
CO2: 25 mEq/L (ref 19–32)
CO2: 26 mEq/L (ref 19–32)
Calcium: 7.5 mg/dL — ABNORMAL LOW (ref 8.4–10.5)
Chloride: 107 mEq/L (ref 96–112)
Chloride: 109 mEq/L (ref 96–112)
Creatinine, Ser: 0.52 mg/dL (ref 0.4–1.2)
GFR calc Af Amer: >60 mL/min (ref >60)
GFR calc Af Amer: >60 mL/min (ref >60)
Glucose, Bld: 145 mg/dL — ABNORMAL HIGH (ref 70–99)
Glucose, Bld: 146 mg/dL — ABNORMAL HIGH (ref 70–99)
Potassium: 3.5 mEq/L (ref 3.5–5.1)
Potassium: 3.8 mEq/L (ref 3.5–5.1)
Sodium: 136 mEq/L (ref 135–145)
Sodium: 138 mEq/L (ref 135–145)
Sodium: 139 mEq/L (ref 135–145)

## 2011-02-12 LAB — DIFFERENTIAL
Basophils Absolute: 0 10*3/uL (ref 0.0–0.1)
Basophils Absolute: 0.1 10*3/uL (ref 0.0–0.1)
Basophils Relative: 0 % (ref 0–1)
Basophils Relative: 0 % (ref 0–1)
Basophils Relative: 1 % (ref 0–1)
Eosinophils Absolute: 0 10*3/uL (ref 0.0–0.7)
Eosinophils Absolute: 0.2 10*3/uL (ref 0.0–0.7)
Eosinophils Absolute: 0.3 10*3/uL (ref 0.0–0.7)
Eosinophils Relative: 2 % (ref 0–5)
Eosinophils Relative: 3 % (ref 0–5)
Monocytes Absolute: 0.4 10*3/uL (ref 0.1–1.0)
Monocytes Absolute: 0.9 10*3/uL (ref 0.1–1.0)
Neutro Abs: 7.6 10*3/uL (ref 1.7–7.7)
Neutro Abs: 8.4 10*3/uL — ABNORMAL HIGH (ref 1.7–7.7)
Neutro Abs: 8.7 10*3/uL — ABNORMAL HIGH (ref 1.7–7.7)
Neutrophils Relative %: 86 % — ABNORMAL HIGH (ref 43–77)

## 2011-02-12 LAB — RETICULOCYTES
RBC.: 2.83 MIL/uL — ABNORMAL LOW (ref 3.87–5.11)
Retic Ct Pct: 2.2 % (ref 0.4–3.1)

## 2011-02-12 LAB — PHOSPHORUS
Phosphorus: 1.3 mg/dL — ABNORMAL LOW (ref 2.3–4.6)
Phosphorus: 2.5 mg/dL (ref 2.3–4.6)
Phosphorus: 2.8 mg/dL (ref 2.3–4.6)
Phosphorus: 4.7 mg/dL — ABNORMAL HIGH (ref 2.3–4.6)

## 2011-02-12 LAB — URINE CULTURE: Culture: NO GROWTH

## 2011-02-12 LAB — PROTIME-INR
Prothrombin Time: 13.9 seconds (ref 11.6–15.2)
Prothrombin Time: 16.5 seconds — ABNORMAL HIGH (ref 11.6–15.2)

## 2011-02-12 LAB — ANAEROBIC CULTURE

## 2011-02-12 LAB — CULTURE, ROUTINE-ABSCESS

## 2011-02-12 LAB — VALPROIC ACID LEVEL: Valproic Acid Lvl: <10 ug/mL — ABNORMAL LOW (ref 50.0–100.0)

## 2011-02-12 LAB — TRIGLYCERIDES: Triglycerides: 147 mg/dL (ref <150)

## 2011-02-12 LAB — APTT: aPTT: 34 seconds (ref 24–37)

## 2011-02-12 LAB — TSH: TSH: 8.719 u[IU]/mL — ABNORMAL HIGH (ref 0.350–4.500)

## 2011-02-12 LAB — LACTIC ACID, PLASMA: Lactic Acid, Venous: 1.3 mmol/L (ref 0.5–2.2)

## 2011-02-12 LAB — VITAMIN B12: Vitamin B-12: 710 pg/mL (ref 211–911)

## 2011-02-13 ENCOUNTER — Telehealth: Payer: Self-pay | Admitting: Internal Medicine

## 2011-02-13 ENCOUNTER — Encounter: Payer: Self-pay | Admitting: Internal Medicine

## 2011-02-23 NOTE — Letter (Signed)
Summary: Appt Reminder 2  [facility]  [address] [place], [place] [address]   Phone: [phone]  Fax: [phone]        February 13, 2011 MRN: [id number]    [facility] [address] [place], [place]  [address]    Dear Ms. Goto,   You have a return appointment with Dr.Gessner on March 27, 2011 at 11:00 am.  Please remember to bring a complete list of the medicines you are taking, your insurance card and your co-pay.  If you have to cancel or reschedule this appointment, please call before 5:00 pm the evening before to avoid a cancellation fee.  If you have any questions or concerns, please call [phone].    Sincerely,    Graciella Freer RN

## 2011-02-23 NOTE — Progress Notes (Signed)
Summary: LFT's now ok off 6 MP  Phone Note Outgoing Call   Summary of Call: LFT's from 3/9 are ok she should stay off and make an appointment to see me in 1-2 months - if she is having any problems now let me know Iva Boop MD, [facility]  February 13, 2011 1:16 PM   Follow-up for Phone Call        [facility] for patient to call back.Graciella Freer RN  February 13, 2011 4:21 PM   Notified patient of need for f/u appointment. Scheduled for 03/27/11 @100am . Informed her to call for problems. Patient stated she is still hurting- down low in the pubic area- she states she has Dicyclomine which helps a little.  Follow-up by: Graciella Freer RN,  February 13, 2011 4:41 PM

## 2011-03-23 ENCOUNTER — Telehealth: Payer: Self-pay | Admitting: Internal Medicine

## 2011-03-23 MED ORDER — HYDROCODONE-ACETAMINOPHEN 5-325 MG PO TABS: 1.0000 | ORAL_TABLET | Freq: Four times a day (QID) | ORAL | Status: AC | PRN

## 2011-03-23 NOTE — Telephone Encounter (Addendum)
Patient c/o abdominal cramping that the dicyclomine does not help.  Denies diarrhea or rectal bleeding.  She has an REV with you on Monday.  Please advise if alternative to dicyclomine before Monday.  See rx for hydrocodone-APAP - printed and signed Iva Boop, MD, [facility]

## 2011-03-24 NOTE — Telephone Encounter (Signed)
Rx sent to her pharmacy 

## 2011-03-27 ENCOUNTER — Encounter: Payer: Self-pay | Admitting: Internal Medicine

## 2011-03-27 ENCOUNTER — Ambulatory Visit (INDEPENDENT_AMBULATORY_CARE_PROVIDER_SITE_OTHER): Payer: BC Managed Care – PPO | Admitting: Internal Medicine

## 2011-03-27 ENCOUNTER — Other Ambulatory Visit (INDEPENDENT_AMBULATORY_CARE_PROVIDER_SITE_OTHER): Payer: BC Managed Care – PPO

## 2011-03-27 VITALS — BP 96/64 | HR 120 | Ht 66.0 in | Wt 149.0 lb

## 2011-03-27 DIAGNOSIS — F319 Bipolar disorder, unspecified: Secondary | ICD-10-CM

## 2011-03-27 DIAGNOSIS — K5000 Crohn's disease of small intestine without complications: Secondary | ICD-10-CM

## 2011-03-27 DIAGNOSIS — D899 Disorder involving the immune mechanism, unspecified: Secondary | ICD-10-CM

## 2011-03-27 DIAGNOSIS — F1020 Alcohol dependence, uncomplicated: Secondary | ICD-10-CM

## 2011-03-27 LAB — CBC WITH DIFFERENTIAL/PLATELET
Basophils Relative: 0.5 % (ref 0.0–3.0)
Eosinophils Relative: 2 % (ref 0.0–5.0)
Lymphocytes Relative: 25.5 % (ref 12.0–46.0)
MCV: 95.4 fl (ref 78.0–100.0)
Monocytes Absolute: 0.8 10*3/uL (ref 0.1–1.0)
Monocytes Relative: 9.3 % (ref 3.0–12.0)
Neutrophils Relative %: 62.7 % (ref 43.0–77.0)
RBC: 3.7 Mil/uL — ABNORMAL LOW (ref 3.87–5.11)
WBC: 8.3 10*3/uL (ref 4.5–10.5)

## 2011-03-27 LAB — BASIC METABOLIC PANEL
BUN: 18 mg/dL (ref 6–23)
CO2: 23 mEq/L (ref 19–32)
Chloride: 103 mEq/L (ref 96–112)
Glucose, Bld: 111 mg/dL — ABNORMAL HIGH (ref 70–99)
Potassium: 4.2 mEq/L (ref 3.5–5.1)

## 2011-03-27 MED ORDER — HYOSCYAMINE SULFATE 0.125 MG SL SUBL: 0.1250 mg | SUBLINGUAL_TABLET | SUBLINGUAL | Status: AC | PRN

## 2011-03-27 NOTE — Patient Instructions (Addendum)
Please go to the basement today for your labs.  Dr Leone Payor has sent your rx for Levsin try to take this for the abdominal pain and spasms before you take the hydrocodone. We have given you some information on Biologic Therapies today.

## 2011-03-29 ENCOUNTER — Encounter: Payer: Self-pay | Admitting: Internal Medicine

## 2011-03-29 NOTE — Assessment & Plan Note (Addendum)
She is now off her Appearing. She is having increasing right lower quadrant pain. I don't really know if this is her Crohn's disease versus IBS or other problems. It does not seem to be disabling. Dicyclomine was controlling but not now. It has increased in the absence of therapy. Currently using some hydrocodone with relief.  I doubt we can rechallenge her with the 6-MP or use azathioprine given the rise in liver tests. It does not seem like there is a problem related to her psychiatric medications the drug interaction seems possible I could not find that on a search. Given her aggressive presentation, she is at high-risk of recurrence and did have microscopic disease on ileal biopsies after her surgery. I think it will be worth a try to use an biologic therapy. I have discussed the risks and benefits of this and given her written information from the Crohn's and colitis [employer] on this. We will explore what options available and most cost effective with her insurance plan. She has a negative PPD and has been tested for hepatitis B surface antigen and is negative.  She will need B12 levels at some point vs. replacement as she is s/p hemicolectomy/ileal resection

## 2011-03-29 NOTE — Assessment & Plan Note (Addendum)
Currently off therapy due to elevated transaminases on 6-MP. She did have normal TPM T. phenotype in August 2011. Has had 2/3 HBV vaccinations and pneumovax.

## 2011-03-29 NOTE — Progress Notes (Signed)
Quick Note:  Labs are ok Need to investigate which biologic she can get through insurance (Remicade, Cimzia, Humira) Please document her HBV vaccines, pneumovax, TB test in Epic if not done - can be seen in [facility] ______

## 2011-03-29 NOTE — Progress Notes (Signed)
  Subjective:    Patient ID: Sheri Washington, female    DOB: 12-23-1965, 45 y.o.   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 She had to stop 6 MP due to elevated transaminases. Knees have normalized. She is having increasing intermittent right lower quadrant pain not responding to dicyclomine anymore. Her recent hydrocodone prescription has helped. She describes intense gas cramp-like pain radiating to the back that will not abate without therapy. She's not having any rectal bleeding which is increased gas and loose bowel movements total moving her bowels about every other day. No fever or chills. No new skin rashes. In general she feels better overall but is slipping back a little bit with this pain. She is again here with her mother as usual.  Past medical and surgical history, allergies, medications, social history, family history reviewed and updated in the EMR.   Review of Systems     Objective:   Physical Exam Well-developed well-nourished no acute distress Lungs clear Heart S1-S2, no rubs, murmurs, gallops The abdomen is soft, there is some mild right lower quadrant tenderness with no mass or fullness detectable. No hepatosplenomegaly. Lower extremities free of edema Psych mildly flat affect       Assessment & Plan:  C. problem-oriented charting. Summary is that she is not on active therapy for her Crohn's disease and is having some increasing symptoms. I can't be sure these are due to Crohn's but she had microscopic disease on colonoscopy, ileal biopsies in late 2011. Given her aggressive presentation therapy is warranted in this sounds like biologics make the most sense. Says been discussed and we're looking into therapies.

## 2011-03-29 NOTE — Progress Notes (Signed)
Quick Note:  Labs ok No elevation of CRP Looking into biologics ______

## 2011-03-30 ENCOUNTER — Telehealth: Payer: Self-pay

## 2011-03-30 DIAGNOSIS — K5090 Crohn's disease, unspecified, without complications: Secondary | ICD-10-CM

## 2011-03-30 NOTE — Telephone Encounter (Signed)
Humira is on her formulary

## 2011-03-30 NOTE — Telephone Encounter (Signed)
Message copied by Darcey Nora on Thu Mar 30, 2011 11:43 AM ------      Message from: Stan Head      Created: Wed Mar 29, 2011  6:33 PM       Labs are ok      Need to investigate which biologic she can get through insurance (Remicade, Cimzia, Humira)      Please document her HBV vaccines, pneumovax, TB test in Epic if not done - can be seen in [employer]

## 2011-04-07 MED ORDER — ADALIMUMAB 40 MG/0.8ML ~~LOC~~ KIT: PACK | SUBCUTANEOUS | Status: DC

## 2011-04-07 NOTE — Telephone Encounter (Signed)
Left message for patient to call back  

## 2011-04-07 NOTE — Telephone Encounter (Signed)
Have her start Humira 160 mg subcutaneous at week 0 (may do 80 mg each day over 2 days) 80 mg subcutaneous at week 2, 40 mg subcutaneous at week 4 and then 40 mg every 4 weeks  See me in 2-3 months, sooner prn

## 2011-04-07 NOTE — Telephone Encounter (Signed)
Patient is advised that I have sent her rx.  She will wait until she gets the Humira discount card in the mail to try and pick up.  She is also advised that once she gets the injections she will need to schedule an nurse visit for teaching.

## 2011-04-11 ENCOUNTER — Other Ambulatory Visit: Payer: Self-pay | Admitting: *Deleted

## 2011-04-11 DIAGNOSIS — K5090 Crohn's disease, unspecified, without complications: Secondary | ICD-10-CM

## 2011-04-11 MED ORDER — ADALIMUMAB 40 MG/0.8ML ~~LOC~~ KIT: PACK | SUBCUTANEOUS | Status: DC

## 2011-04-11 NOTE — Telephone Encounter (Signed)
Received via fax prior auth for Humira.[facility] [phone] and spoke with Jenel Lucks and she did prior auth and it is approved until 04-10-2012. Jenel Lucks stated has to be done every year. RX has to be sent to [facility] per Darcey Nora, RN ok to do.. Called patient and left message that she has to call [facility] at [phone] and give them information about her discount card.

## 2011-04-13 ENCOUNTER — Telehealth: Payer: Self-pay | Admitting: Internal Medicine

## 2011-04-13 NOTE — Telephone Encounter (Signed)
Left message for patient to call back  

## 2011-04-13 NOTE — Telephone Encounter (Signed)
Patient advised she needs to contact [facility] and let us know once she has the humira and we will do the teaching.

## 2011-04-14 ENCOUNTER — Telehealth: Payer: Self-pay | Admitting: Internal Medicine

## 2011-04-14 ENCOUNTER — Other Ambulatory Visit: Payer: Self-pay | Admitting: Internal Medicine

## 2011-04-14 ENCOUNTER — Other Ambulatory Visit: Payer: Self-pay | Admitting: *Deleted

## 2011-04-14 DIAGNOSIS — K5090 Crohn's disease, unspecified, without complications: Secondary | ICD-10-CM

## 2011-04-14 MED ORDER — ADALIMUMAB 40 MG/0.8ML ~~LOC~~ KIT: PACK | SUBCUTANEOUS | Status: DC

## 2011-04-14 NOTE — Telephone Encounter (Signed)
Called patient RX needed to be faxed to [facility] pharmacy per patient

## 2011-04-14 NOTE — Telephone Encounter (Signed)
Rx faxed

## 2011-04-19 ENCOUNTER — Telehealth: Payer: Self-pay | Admitting: Internal Medicine

## 2011-04-20 NOTE — Telephone Encounter (Signed)
Talked with mail order to let them know patient needs Humira starter kit then 2 pen there after with 3 refills.

## 2011-04-21 ENCOUNTER — Telehealth: Payer: Self-pay | Admitting: Internal Medicine

## 2011-04-21 NOTE — Telephone Encounter (Signed)
Pt states that she received her Humira but needs to come in and have instructions on how she and her husband are to give the med. Appt scheduled for pt for 04/25/11@11am . Pt aware of appt date and time.

## 2011-04-25 ENCOUNTER — Ambulatory Visit (INDEPENDENT_AMBULATORY_CARE_PROVIDER_SITE_OTHER): Payer: BC Managed Care – PPO | Admitting: Internal Medicine

## 2011-04-25 DIAGNOSIS — K5090 Crohn's disease, unspecified, without complications: Secondary | ICD-10-CM

## 2011-04-25 NOTE — Progress Notes (Signed)
The patient and her husband arrived here today for Humira teaching.  The patient is not comfortable injecting herself so she wants her husband to do the injections for her.  I demonstrated how to inject the humira using aseptic technique using the first injection pen.  The patient's husband properly demonstrated the additional 3 injections.  I provided her patient safety information on Humira and stressed to her and her husband that she should contact us with any cold or respiratory symptoms, open sores, or fevers prior to giving any injections.  She and her husband verbalized understanding.  She understands the proper schedule for injection and knows to use the other 2 syringes in the starter kit in 2 weeks, followed by the maintenance of 1 syringe q 2 weeks.  They are asked to call back for any questions they have at all about the medication or injections.

## 2011-07-04 ENCOUNTER — Encounter: Payer: Self-pay | Admitting: Internal Medicine

## 2011-07-04 ENCOUNTER — Telehealth: Payer: Self-pay | Admitting: Internal Medicine

## 2011-07-04 ENCOUNTER — Other Ambulatory Visit (INDEPENDENT_AMBULATORY_CARE_PROVIDER_SITE_OTHER): Payer: BC Managed Care – PPO

## 2011-07-04 ENCOUNTER — Ambulatory Visit (INDEPENDENT_AMBULATORY_CARE_PROVIDER_SITE_OTHER): Payer: BC Managed Care – PPO | Admitting: Internal Medicine

## 2011-07-04 VITALS — BP 100/60 | HR 128 | Ht 66.0 in | Wt 165.0 lb

## 2011-07-04 DIAGNOSIS — R630 Anorexia: Secondary | ICD-10-CM

## 2011-07-04 DIAGNOSIS — R197 Diarrhea, unspecified: Secondary | ICD-10-CM | POA: Insufficient documentation

## 2011-07-04 DIAGNOSIS — R5381 Other malaise: Secondary | ICD-10-CM

## 2011-07-04 DIAGNOSIS — R5383 Other fatigue: Secondary | ICD-10-CM

## 2011-07-04 LAB — CBC WITH DIFFERENTIAL/PLATELET
Eosinophils Absolute: 0.1 10*3/uL (ref 0.0–0.7)
Eosinophils Relative: 1.6 % (ref 0.0–5.0)
HCT: 37 % (ref 36.0–46.0)
Lymphs Abs: 2.5 10*3/uL (ref 0.7–4.0)
MCHC: 33.6 g/dL (ref 30.0–36.0)
MCV: 92.2 fl (ref 78.0–100.0)
Monocytes Absolute: 0.6 10*3/uL (ref 0.1–1.0)
Platelets: 257 10*3/uL (ref 150.0–400.0)
WBC: 7.6 10*3/uL (ref 4.5–10.5)

## 2011-07-04 LAB — COMPREHENSIVE METABOLIC PANEL
Alkaline Phosphatase: 53 U/L (ref 39–117)
BUN: 6 mg/dL (ref 6–23)
Glucose, Bld: 120 mg/dL — ABNORMAL HIGH (ref 70–99)
Sodium: 140 mEq/L (ref 135–145)
Total Bilirubin: 0.5 mg/dL (ref 0.3–1.2)
Total Protein: 6.7 g/dL (ref 6.0–8.3)

## 2011-07-04 NOTE — Telephone Encounter (Signed)
Amy's schedule is full today. Pt is coming at 3pm to see Dr. Leone Payor. Waiting to have schedule fixed in computer to enter the appt.

## 2011-07-04 NOTE — Telephone Encounter (Signed)
She needs to be seen this week - I could see her this PM if Amy cannot as I only have 1 case in [facility]

## 2011-07-04 NOTE — Patient Instructions (Signed)
You will go to the basement today for labs You should hear from [place] by Aug 13th you should contact our office Follow diet given on separate sheet

## 2011-07-04 NOTE — Assessment & Plan Note (Signed)
New issue - had been ok. ? C diff given recent Abx vs. Crohn's exacerbation. ? Other infection. It does not soul like a Crohn's flare to me but will work up with labs ad stool studies as per orders. BRAT diet. No need for antidiarrheals yet - does not appear dehydrated.

## 2011-07-04 NOTE — Assessment & Plan Note (Signed)
Current problems seem like an infection but could be Crohn's

## 2011-07-04 NOTE — Telephone Encounter (Signed)
Pts husband is calling to report that his wife has not been feeling well and she admitted to him that she has had diarrhea for a week and a half. She has a history of crohn's and is currently taking Humira. Please advise.

## 2011-07-04 NOTE — Progress Notes (Signed)
  Subjective:    Patient ID: Sheri Washington, female    DOB: [birth year], 45 y.o.   MRN: [id number]  HPI Two weeks of problems. Diarrhea x 1.5 weeks with watery stools, 3-4 a day without nocturnal. Anorectic. No fever. Significant fatigue, also with frontal headaches. No nausea or vomiting. She stopped Zyprexa 2 months ago. She was gaining weight and sleeping too much. She took antibiotics for something 1 month ago but is not sure what that was. Did not finish it. Her memory is off. Slightly blurry vision last weekend - transient without other neuro symptoms. Mild muscle aches, no abdominal pain. No sick contacts. Travelled to [place], [place] past weekend. but was ill before. No alcohol. Review of Systems As above    Objective:   Physical Exam General: NAD Eyes: anicteric Pharynx: clear Lungs: clear Heart: S1S2 no rubs, murmurs or gallops Abdomen: soft and nontender, BS+ Ext: no edema           Assessment & Plan:

## 2011-07-05 ENCOUNTER — Other Ambulatory Visit: Payer: BC Managed Care – PPO

## 2011-07-05 ENCOUNTER — Telehealth: Payer: Self-pay | Admitting: Internal Medicine

## 2011-07-05 DIAGNOSIS — R197 Diarrhea, unspecified: Secondary | ICD-10-CM

## 2011-07-05 DIAGNOSIS — R630 Anorexia: Secondary | ICD-10-CM

## 2011-07-05 DIAGNOSIS — R5383 Other fatigue: Secondary | ICD-10-CM

## 2011-07-05 NOTE — Telephone Encounter (Signed)
lmom for pt to call back if she needs anything else. I am assuming she reported the OMNICEF because of her appt yesterday. I will inform Dr Leone Payor. If she needs anything else, I've asked her to call me back.

## 2011-07-06 LAB — CLOSTRIDIUM DIFFICILE BY PCR: Toxigenic C. Difficile by PCR: NOT DETECTED

## 2011-07-07 ENCOUNTER — Telehealth: Payer: Self-pay

## 2011-07-07 DIAGNOSIS — R197 Diarrhea, unspecified: Secondary | ICD-10-CM

## 2011-07-07 MED ORDER — COLESTIPOL HCL 5 G PO GRAN: 5.0000 g | GRANULES | Freq: Two times a day (BID) | ORAL | Status: DC

## 2011-07-07 NOTE — Assessment & Plan Note (Addendum)
Stool studies and labs do not show infection or inflammation. Trial of colestipol ? 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

## 2011-07-07 NOTE — Telephone Encounter (Signed)
Addended by: Iva Boop on: 07/07/2011 03:44 PM   Modules accepted: Orders

## 2011-07-07 NOTE — Telephone Encounter (Signed)
She may have problems from her surgery that respond to bile salt binders I prescribed colestipol twice a day - she can start once a day with supper and see if it helps and increase to bid if needs that. Should notice a difference in 2 weeks or so - if it fixes things stay on until follow-up routine - should see her in 2-3 months so make an appointment, otherwise if not better call me back after 2-3 weeks

## 2011-07-07 NOTE — Telephone Encounter (Signed)
Pt aware. Pt states that she is still having diarrhea. Pt reports she is having about 3 diarrhea stools/day.

## 2011-07-07 NOTE — Telephone Encounter (Signed)
Left message for pt to call back  °

## 2011-07-07 NOTE — Progress Notes (Signed)
Quick Note:  Her labs and stool studies were all ok. Is she still having diarrhea?  ______

## 2011-07-07 NOTE — Telephone Encounter (Signed)
Message copied by Michele Mcalpine on Fri Jul 07, 2011  3:07 PM ------      Message from: Iva Boop      Created: Fri Jul 07, 2011  1:49 PM       Her labs and stool studies were all ok.      Is she still having diarrhea?

## 2011-07-09 LAB — STOOL CULTURE

## 2011-07-10 NOTE — Telephone Encounter (Signed)
Pt aware. Pt states she will call back to schedule the appt with Dr. Leone Payor.

## 2011-08-14 ENCOUNTER — Telehealth: Payer: Self-pay | Admitting: Internal Medicine

## 2011-08-14 MED ORDER — PROMETHAZINE HCL 25 MG PO TABS: 25.0000 mg | ORAL_TABLET | Freq: Four times a day (QID) | ORAL | Status: DC | PRN

## 2011-08-14 NOTE — Telephone Encounter (Signed)
Patient had a viral illness last week.  She is still having some residual nausea.  She would like to know if we can call her in something for nausea?

## 2011-08-14 NOTE — Telephone Encounter (Signed)
REV scheduled for 09/20/11

## 2011-08-14 NOTE — Telephone Encounter (Signed)
I sent promethazine 25 mg She needs to schedule REV for Oct

## 2011-08-14 NOTE — Telephone Encounter (Signed)
Has she seen other MD's for that or anything else or has medication list changed since then?  May rx but need to know those answers  She is also supposed to schedule a f/u with me in Oct or Nov - based upon last visit and eval recommendations

## 2011-08-14 NOTE — Telephone Encounter (Signed)
No not seen other MD.  She had vomiting last week that she feels was a bug/viral or food poisoning.  She has no changes to her med list it is up to date.  What do you want to call in?

## 2011-09-20 ENCOUNTER — Encounter: Payer: Self-pay | Admitting: Internal Medicine

## 2011-09-20 ENCOUNTER — Ambulatory Visit (INDEPENDENT_AMBULATORY_CARE_PROVIDER_SITE_OTHER): Payer: BC Managed Care – PPO | Admitting: Internal Medicine

## 2011-09-20 DIAGNOSIS — R635 Abnormal weight gain: Secondary | ICD-10-CM

## 2011-09-20 DIAGNOSIS — D899 Disorder involving the immune mechanism, unspecified: Secondary | ICD-10-CM

## 2011-09-20 DIAGNOSIS — K5000 Crohn's disease of small intestine without complications: Secondary | ICD-10-CM

## 2011-09-20 DIAGNOSIS — Z23 Encounter for immunization: Secondary | ICD-10-CM

## 2011-09-20 DIAGNOSIS — R197 Diarrhea, unspecified: Secondary | ICD-10-CM

## 2011-09-20 NOTE — Progress Notes (Signed)
  Subjective:    Patient ID: Sheri Washington, female    DOB: 1966/02/10, 45 y.o.   MRN: [id number]  HPI Crohn's disease follow-up, on Humira Gaining weight and feels bloated. Eating a lot of salads so puzzled as to why she is gaining weight. Having diarrhea "all the time". Stools are not normal and occasionally liquid. Urgent defecation at times. Not noctunal. Had nausea, vomiting and diarrhea for 2 weeks a month ago - resolved. Had a fever then, not now.   Review of Systems As above    Objective:   Physical Exam WDWN overweight Lungs clear Heart S1S2 abd soft and nontender       Assessment & Plan:  Crohn's disease with overall improvement. Think weight gain is likely from psychiatric meds and/or improved overall health and absorption now that Crohn's is treated. 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 Needs to comply with bid colestipol and will consider changing to pills instead if it works, she may add loperamide also. Follow-up in 3 months, call back at 1 month if not better.

## 2011-09-20 NOTE — Assessment & Plan Note (Signed)
Influenza vaccine today

## 2011-09-20 NOTE — Assessment & Plan Note (Signed)
Crohn's and/or resection and possibly IBS. Needs to comply with colestipol and may add loperamide.

## 2011-09-20 NOTE — Patient Instructions (Addendum)
You have been given a separate informational sheet regarding your tobacco use, the importance of quitting and local resources to help you quit. Low Fiber Low Residue Diet handout given. Return to see Dr. Leone Payor in 3 months. Flu shot given to you today. Make sure to take the Colestipol twice a day and can add Imodium 1-2 a day.If still having diarrhea after a month of this and fiber call back.

## 2011-09-20 NOTE — Assessment & Plan Note (Addendum)
She is improved but still having diarrhea. 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 She does not drink milk. She is eating a lot of salads so a low fiber diet will be provided. I've also asked her to comply with the twice-daily colestipol dose. She can take loperamide one or 2 every day as well. If these things fail to help in a month I want her to call me back. Otherwise we'll see her in January.

## 2011-11-07 ENCOUNTER — Other Ambulatory Visit: Payer: Self-pay | Admitting: Internal Medicine

## 2011-11-07 DIAGNOSIS — K5090 Crohn's disease, unspecified, without complications: Secondary | ICD-10-CM

## 2011-11-07 MED ORDER — ADALIMUMAB 40 MG/0.8ML ~~LOC~~ KIT: PACK | SUBCUTANEOUS | Status: DC

## 2011-11-07 NOTE — Telephone Encounter (Signed)
Refilled Humira #2 pens with 6 refills to [facility].

## 2011-11-15 ENCOUNTER — Telehealth: Payer: Self-pay | Admitting: Internal Medicine

## 2011-11-15 MED ORDER — DICYCLOMINE HCL 20 MG PO TABS: 20.0000 mg | ORAL_TABLET | Freq: Four times a day (QID) | ORAL | Status: DC | PRN

## 2011-11-15 NOTE — Telephone Encounter (Signed)
Patient advised.  She will call back to schedule REV

## 2011-11-15 NOTE — Telephone Encounter (Signed)
The Medrol may help her  She will need to schedule an REV (January) and call back prn sooner. She should take loperamide and/or antispasmodics she has as needed also

## 2011-11-15 NOTE — Telephone Encounter (Signed)
Patient is having a lot of cramping, diarrhea and urgency.  She was started on Medrol dose pack today for sciatic nerve and leg pain.  She reports that she had symptoms prior to starting on Medrol. Dr Leone Payor please advise

## 2011-11-15 NOTE — Telephone Encounter (Signed)
Not currently on anti-spasmodic so dicyclomine 20 mg every 6 hours as needed # 90 no refills is ok She should still be taking her colestipol also

## 2011-11-16 ENCOUNTER — Telehealth: Payer: Self-pay | Admitting: Internal Medicine

## 2011-11-16 NOTE — Telephone Encounter (Signed)
Patient informed. 

## 2011-11-16 NOTE — Telephone Encounter (Signed)
I talked to the pharmacy tech at [facility] and she stated that they did receive the rx for the Dicyclomine that was sent in. Called and left a message for the patient to return my call.

## 2011-12-12 ENCOUNTER — Other Ambulatory Visit: Payer: Self-pay | Admitting: Internal Medicine

## 2011-12-12 DIAGNOSIS — K5090 Crohn's disease, unspecified, without complications: Secondary | ICD-10-CM

## 2011-12-12 MED ORDER — ADALIMUMAB 40 MG/0.8ML ~~LOC~~ KIT: PACK | SUBCUTANEOUS | Status: DC

## 2011-12-12 NOTE — Telephone Encounter (Signed)
Talked with [facility] and filled Humira pens # 6 with 1 refill (phone in). Refill on 11/07/11 was printed not sure if medication got refilled.

## 2012-01-02 ENCOUNTER — Ambulatory Visit: Payer: BC Managed Care – PPO | Admitting: Internal Medicine

## 2012-02-03 IMAGING — CT CT ABD-PELV W/ CM
2 of 5 series · 16 of 46 positions shown, 18 images · IV contrast (water/omni  & 100 ML OMNI 300)
Comparison: Radiographs dated 05/30/2010

CLINICAL DATA: Severe abdominal pain with fever.

CT ABDOMEN AND PELVIS WITH CONTRAST
TECHNIQUE: Multidetector CT imaging of the abdomen and pelvis was
performed following the standard protocol during bolus
administration of intravenous contrast.
Contrast: 100 ml Emnipaque-YCC

[Series 2: routine abdomen · axial · 0.70mm/px · z∈[-529,-59]mm · 13 of 104 slices shown, 15 images]
[im 5/104  soft-tissue]
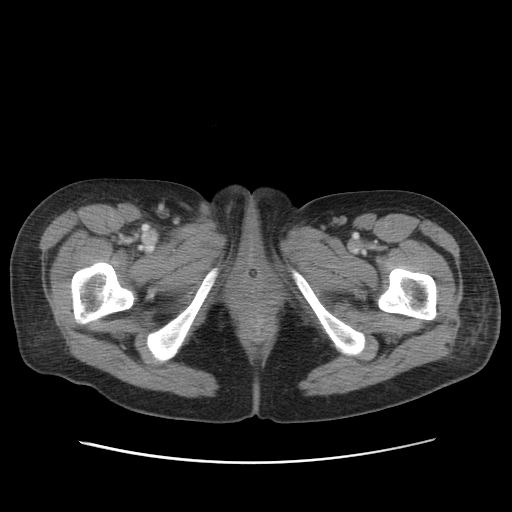
[im 5/104  bone]
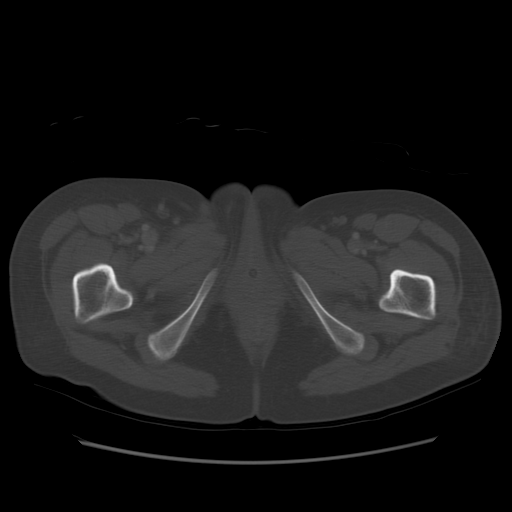
[im 15/104  soft-tissue]
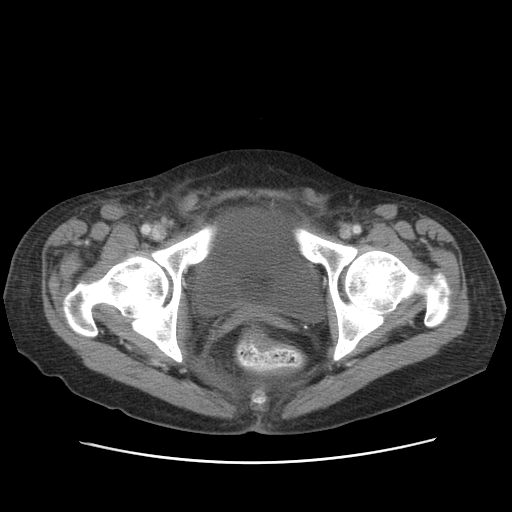
[im 20/104  soft-tissue]
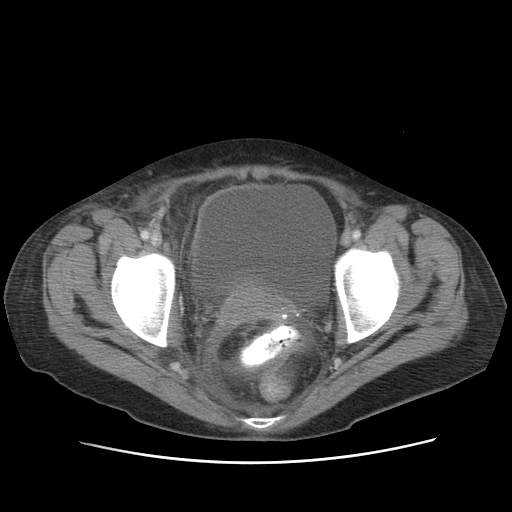
[im 30/104  soft-tissue]
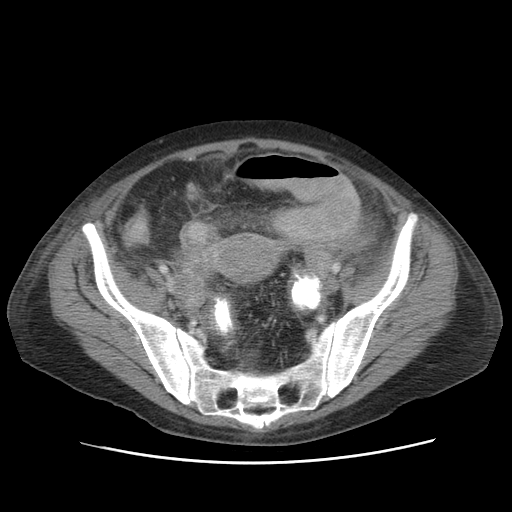
[im 35/104  soft-tissue]
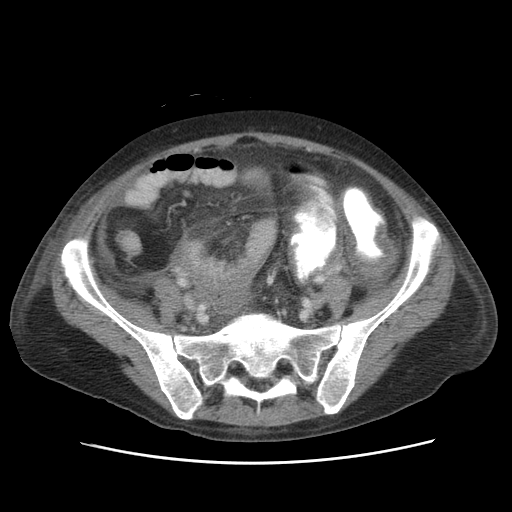
[im 45/104  soft-tissue]
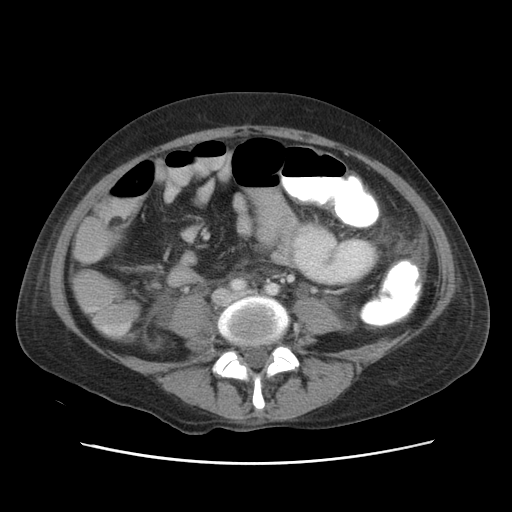
[im 54/104  soft-tissue]
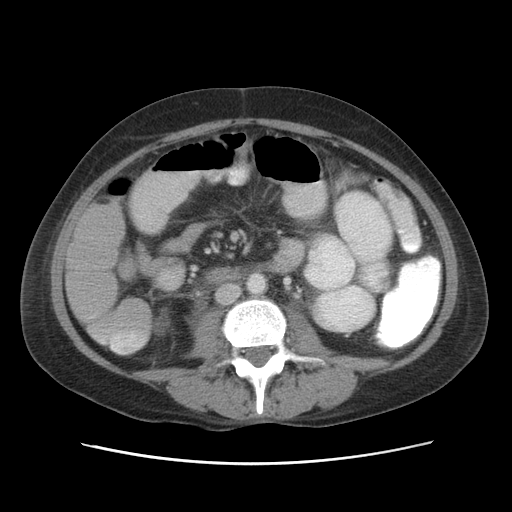
[im 59/104  soft-tissue]
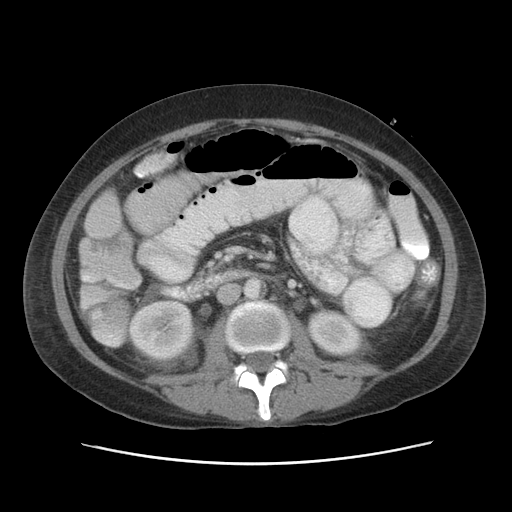
[im 69/104  soft-tissue]
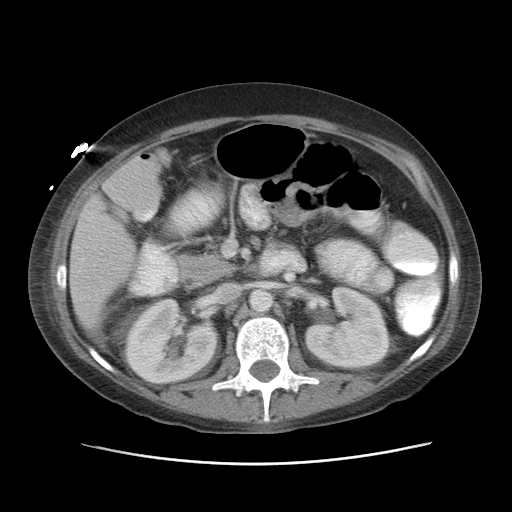
[im 69/104  bone]
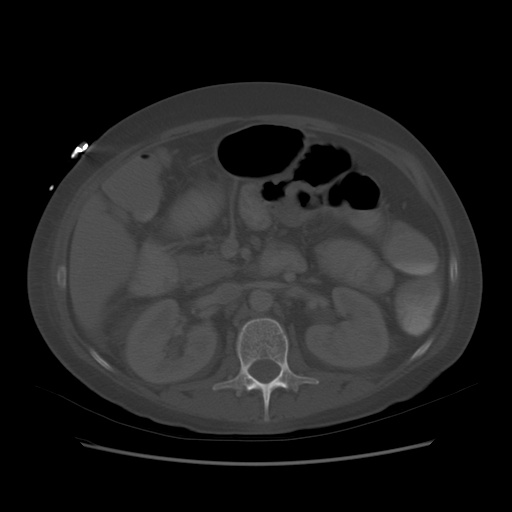
[im 74/104  soft-tissue]
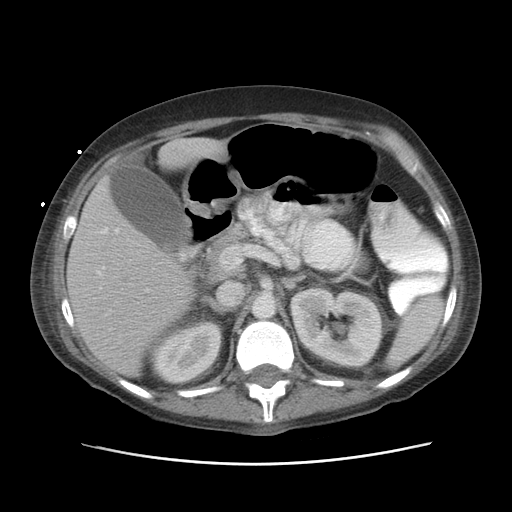
[im 84/104  soft-tissue]
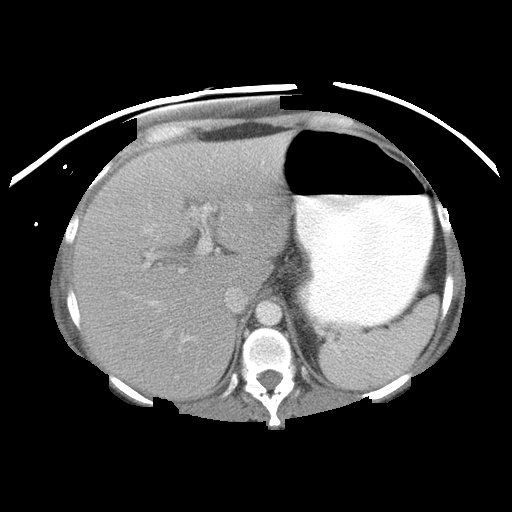
[im 89/104  soft-tissue]
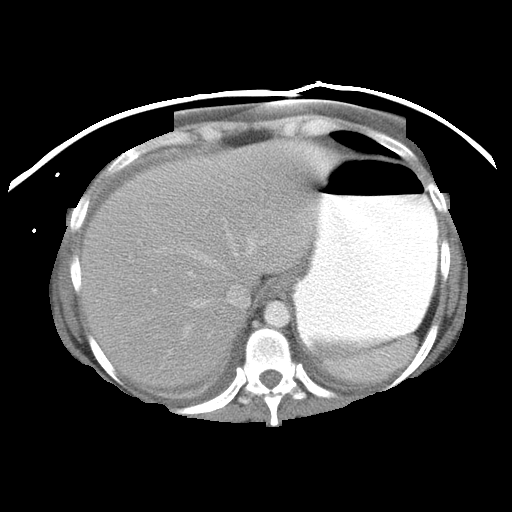
[im 99/104  soft-tissue]
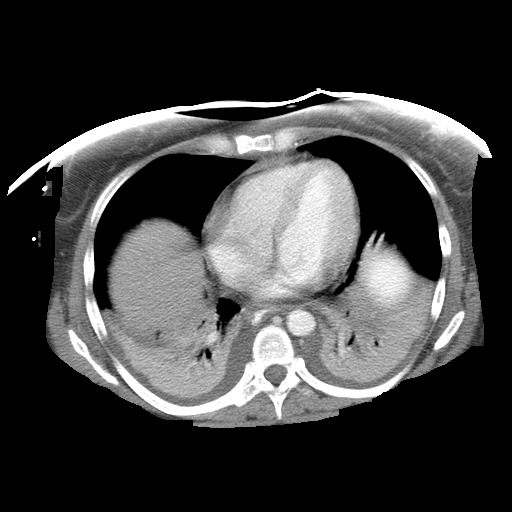

[Series 401: reformatted · coronal · 1.03mm/px · 3 of 84 slices shown]
[im 28/84  soft-tissue]
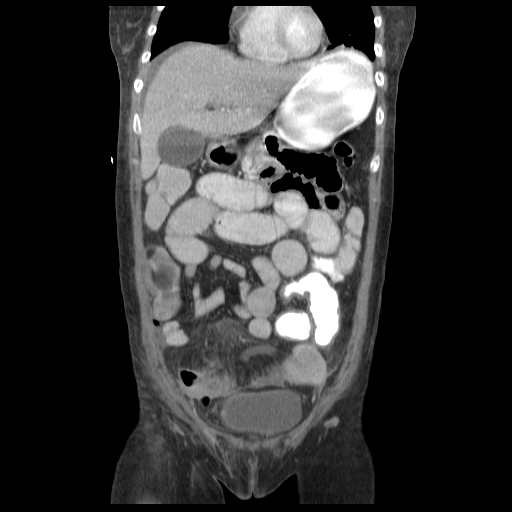
[im 37/84  soft-tissue]
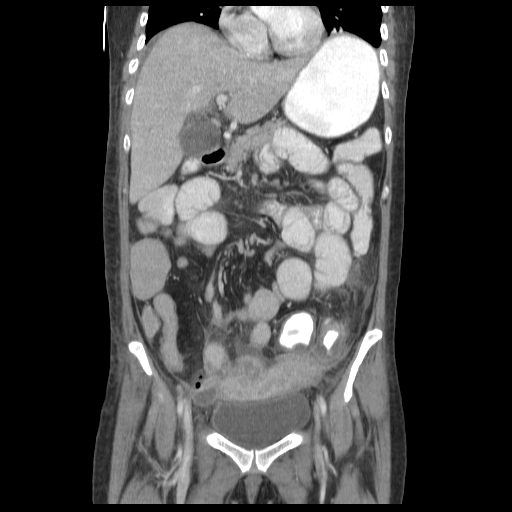
[im 47/84  soft-tissue]
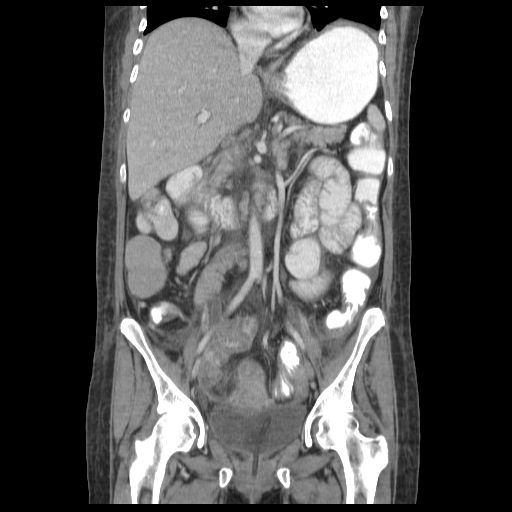

[16 of 46 positions shown; findings below may reference images not displayed]

FINDINGS: The 40 cm of the distal ileum are markedly abnormal with
edematous mucosa and abnormal dilatation with a discrete fistula
seen between two adjacent loops of small bowel in the right lower
quadrant.  There is only slight edema of the mucosa of the extreme
terminal ileum.  The cecum and appendix are normal.

There is also edema of the mucosa of the distal descending colon
and the sigmoid portion of  the colon.  There is free fluid in the
pelvis with a well defined 2 cm abscess with fluid and air within
it in the right adnexa.  There is an adjacent 5 x 3 cm abscess
slightly more inferior and anterior,  adjacent to the right side of
the bladder.

There is a small amount of ascites in the abdomen.  There is a
small amount of fluid around both kidneys.

There is extensive bibasilar atelectasis and small bilateral
pleural effusions.

The liver, spleen, pancreas, adrenal glands, and kidneys are
otherwise normal.

There is no evidence of bowel obstruction.

The uterus is normal.  There are follicles seen on the left ovary.
The right ovary is obscured by the inflammatory processes.

No bony abnormalities.
IMPRESSION: 1.  Evidence of active inflammatory bowel disease, probably Crohn's
disease.
2.  Multiple pelvic abscesses with marked edema of the distal ileum
as well as of the mucosa of the distal colon.
3.  Extensive bibasilar atelectasis and small bilateral pleural
effusions.
4.  Fistula between two loops of small bowel in the right lower
quadrant.

Critical test results telephoned to Dr. Mandernach at the time of
interpretation on 05/30/2010 at [DATE] p.m. by Dr. Sarada.

## 2012-04-25 ENCOUNTER — Encounter: Payer: Self-pay | Admitting: Internal Medicine

## 2012-04-25 ENCOUNTER — Ambulatory Visit (INDEPENDENT_AMBULATORY_CARE_PROVIDER_SITE_OTHER): Payer: PRIVATE HEALTH INSURANCE | Admitting: Internal Medicine

## 2012-04-25 VITALS — BP 100/70 | HR 68 | Ht 67.0 in | Wt 161.0 lb

## 2012-04-25 DIAGNOSIS — R197 Diarrhea, unspecified: Secondary | ICD-10-CM

## 2012-04-25 DIAGNOSIS — K5000 Crohn's disease of small intestine without complications: Secondary | ICD-10-CM

## 2012-04-25 MED ORDER — COLESTIPOL HCL 1 G PO TABS: 2.0000 g | ORAL_TABLET | Freq: Two times a day (BID) | ORAL | Status: DC

## 2012-04-25 NOTE — Assessment & Plan Note (Addendum)
Try Colestipol 1 g twice a day. I wrote for 2 g twice daily as she has a room to titrate. 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 It's quite possible she does have at least some given the severity of her Crohn's disease.

## 2012-04-25 NOTE — Assessment & Plan Note (Addendum)
Off Humira since January 2013. Symptoms have not really changed. She has diarrhea which is in part at least post resection. And then leave her off active therapy at this point and see her back in about 3-4 months to reassess. Her husband thinks will be on a different insurance and have coverage for her Crohn's disease at that time so we could possibly reinitiate Humira or other biologic therapy. She was advised to pursue a bone density study to her primary care practice. She had this left foot fracture, she is at risk for osteoporosis given her Crohn's disease and resection. Vitamin D level was treated and was high normal in 2011.

## 2012-04-25 NOTE — Progress Notes (Signed)
  Subjective:    Patient ID: Sheri Washington, female    DOB: 1966/05/23, 46 y.o.   MRN: [id number]  HPI This middle-aged white woman is here with her husband. She has Crohn's disease, status post ileocolonic resection and drainage of abscess. She had been on Humira but there was an insurance change early in the year and because of the pre-existing condition she cannot get prescription help with this medication so she has been off. She actually has been having some constipation, 2 packets of colestipol daily severely constipated her. She reduce to one and still has some constipation. If she doesn't take any of which 4-5 explosive watery bowel movements a day. She is not having much abdominal pain lately, having not needed the dicyclomine which does help that. There may have been a spot or 2 of blood with wiping at times after diarrhea but no significant bleeding as reported. Medications, allergies, past medical history, past surgical history, family history and social history are reviewed and updated in the EMR.   Review of Systems Positive for fatigue and joint stiffness, or abdominal scar itches   she is having teeth problems, there deteriorating and falling out, she had a fracture in the left foot without injury Objective:   Physical Exam General:  NAD Eyes:   anicteric Lungs:  clear Heart:  S1S2 no rubs, murmurs or gallops Abdomen:  soft and nontender, BS+ Ext:   no edema        Assessment & Plan:   1. CROHN'S DISEASE-SMALL INTESTINE   2. Diarrhea    See problem oriented charting assessment and plans.  CC: Arlina Robes, MD

## 2012-04-25 NOTE — Patient Instructions (Signed)
We have sent the following medications to your pharmacy for you to pick up at your convenience: Colestid  Contact your PCP about getting a bone density scan.  Follow-up with [place] in September 2013.

## 2012-06-03 ENCOUNTER — Other Ambulatory Visit: Payer: Self-pay

## 2012-06-03 MED ORDER — DICYCLOMINE HCL 20 MG PO TABS: 20.0000 mg | ORAL_TABLET | Freq: Four times a day (QID) | ORAL | Status: DC | PRN

## 2012-06-03 NOTE — Telephone Encounter (Signed)
Per Dr. Leone Payor ok to refill pt's Dicyclomine 20mg  Rx.

## 2012-09-04 ENCOUNTER — Telehealth: Payer: Self-pay | Admitting: Internal Medicine

## 2012-09-04 NOTE — Telephone Encounter (Signed)
Left message for patient to call back  

## 2012-09-04 NOTE — Telephone Encounter (Signed)
Patient reports terrible abdominal cramping that started last night.  "feels like labor pains".  She denies other symptoms diarrhea, rectal bleeding, fever, nausea or vomiting. She reports that she has had constipation for the last few days and has held the colestipol.  "I feel like I have a stomach virus on top of constipation".  She reports that she has tried dicyclomine, but it is not helping.  SH states no problems with her crohn's for the last few months. She is requesting something different for pain.  Please advise

## 2012-09-04 NOTE — Telephone Encounter (Signed)
In her case she should be assessed in person as it could be Crohn's and she is on immunosuppressives

## 2012-09-05 NOTE — Telephone Encounter (Signed)
Left message for patient to call back  

## 2012-09-06 NOTE — Telephone Encounter (Signed)
Patient did call back and schedule an office visit for 10/08/12.

## 2012-10-08 ENCOUNTER — Ambulatory Visit: Payer: PRIVATE HEALTH INSURANCE | Admitting: Internal Medicine

## 2012-10-15 ENCOUNTER — Ambulatory Visit: Payer: PRIVATE HEALTH INSURANCE | Admitting: Internal Medicine

## 2013-01-01 ENCOUNTER — Ambulatory Visit: Payer: PRIVATE HEALTH INSURANCE | Admitting: Internal Medicine

## 2013-01-16 ENCOUNTER — Other Ambulatory Visit: Payer: Self-pay | Admitting: Internal Medicine

## 2013-01-16 ENCOUNTER — Other Ambulatory Visit (INDEPENDENT_AMBULATORY_CARE_PROVIDER_SITE_OTHER): Payer: Self-pay

## 2013-01-16 ENCOUNTER — Encounter: Payer: Self-pay | Admitting: Internal Medicine

## 2013-01-16 ENCOUNTER — Ambulatory Visit (INDEPENDENT_AMBULATORY_CARE_PROVIDER_SITE_OTHER): Payer: Self-pay | Admitting: Internal Medicine

## 2013-01-16 VITALS — BP 112/78 | HR 95 | Ht 66.0 in | Wt 156.0 lb

## 2013-01-16 DIAGNOSIS — R197 Diarrhea, unspecified: Secondary | ICD-10-CM

## 2013-01-16 DIAGNOSIS — K5000 Crohn's disease of small intestine without complications: Secondary | ICD-10-CM

## 2013-01-16 LAB — VITAMIN B12: Vitamin B-12: 440 pg/mL (ref 211–911)

## 2013-01-16 LAB — CBC WITH DIFFERENTIAL/PLATELET
Basophils Absolute: 0 10*3/uL (ref 0.0–0.1)
Eosinophils Relative: 1.1 % (ref 0.0–5.0)
Lymphocytes Relative: 22.4 % (ref 12.0–46.0)
Monocytes Relative: 5.7 % (ref 3.0–12.0)
Neutrophils Relative %: 70.5 % (ref 43.0–77.0)
Platelets: 355 10*3/uL (ref 150.0–400.0)
RDW: 13.6 % (ref 11.5–14.6)
WBC: 10.5 10*3/uL (ref 4.5–10.5)

## 2013-01-16 LAB — C-REACTIVE PROTEIN: CRP: <0.5 mg/dL (ref 0.5–20.0)

## 2013-01-16 MED ORDER — DIPHENOXYLATE-ATROPINE 2.5-0.025 MG PO TABS: 1.0000 | ORAL_TABLET | Freq: Three times a day (TID) | ORAL | Status: DC

## 2013-01-16 NOTE — Progress Notes (Signed)
Subjective:    Patient ID: Sheri Washington, female    DOB: 05/16/1966, 47 y.o.   MRN: [id number]  HPI Sheri Washington returns for follow-up. She is still off therapy as not able to get insurance yet. Trying to get insured through the exchange - things are muddled. She is continuing with 3-4 loose stools a day, no blood. She got constipated and felt terrible so reduced colestipol and is on 1 gram a day. Has used some loperamide with help. Dicyclomine does not seem to help much.  No Known Allergies Outpatient Prescriptions Prior to Visit  Medication Sig Dispense Refill  . BIOTIN PO Take 1 capsule by mouth daily.       . Cholecalciferol (VITAMIN D-3) 5000 UNITS TABS Take 1 tablet by mouth daily.      . divalproex (DEPAKOTE) 500 MG EC tablet Take 500 mg by mouth 2 (two) times daily.       Marland Kitchen levothyroxine (SYNTHROID) 50 MCG tablet Take 50 mcg by mouth daily.        Marland Kitchen OLANZapine (ZYPREXA) 5 MG tablet Take 5 mg by mouth at bedtime.      . Calcium Carb-Cholecalciferol (CALCIUM 1000 + D PO) Take 1 capsule by mouth 3 (three) times daily.      . colestipol (COLESTID) 1 G tablet Take 2 tablets (2 g total) by mouth 2 (two) times daily.  60 tablet  5  . dicyclomine (BENTYL) 20 MG tablet Take 1 tablet (20 mg total) by mouth every 6 (six) hours as needed.  90 tablet  4  . DULoxetine (CYMBALTA) 60 MG capsule Take 120 mg by mouth daily.        . Fish Oil OIL Take by mouth. Mega Red Takes one capsule daily       . topiramate (TOPAMAX) 50 MG tablet Take 50 mg by mouth 2 (two) times daily.         No facility-administered medications prior to visit.   Past Medical History  Diagnosis Date  . Alcoholism   . Hypothyroidism   . Bipolar affective disorder   . Anemia   . Vitamin D deficiency   . Depression   . Crohn's disease of small intestine 2011    fistulizing, abscess, ileocolonic resection  . Foot fracture, left    Past Surgical History  Procedure Laterality Date  . Tonsillectomy    . Foot surgery    .  Hemicolectomy  06/17/10    Ileocecectomy w/ anastomosis and seperate small bowel resection w/ anastomosis  . Colonoscopy  08/23/2010    microscopic ileitis - Crohn's   Review of Systems     Objective:   Physical Exam General:  NAD Eyes:   anicteric Lungs:  clear Heart:  S1S2 no rubs, murmurs or gallops Abdomen:  soft and nontender, BS+, midline scar Ext:   no edema       Assessment & Plan:  Crohn's disease of small intestine - Plan: diphenoxylate-atropine (LOMOTIL) 2.5-0.025 MG per tablet, CBC with Differential, Vitamin B12, C-reactive protein  Diarrhea - Plan: diphenoxylate-atropine (LOMOTIL) 2.5-0.025 MG per tablet  Await labs and will call DC dicyclomine and add the Lomotil. She is reluctoant to increase Colestipol again though was at 2 g bid. Needs to get back on Tx - hopeful to get insurance soon and she will let me know so we can re-rx Humira May consider budesonide if I could get her samples.. See me in 3 months, sooner prn.  Will add TSH to labs if we  can  AV:[id number] Baldo Ash, MD

## 2013-01-16 NOTE — Progress Notes (Signed)
Add-on form being faxed to lab for TSH, also called and notified Vicky in the lab.  Ordered per Dr. Stan Head.

## 2013-01-16 NOTE — Patient Instructions (Addendum)
Your physician has requested that you go to the basement for the following lab work before leaving today: CBC/diff, C-Reactive Protein, B-12 level  We will call you with the lab results.  We are faxing your Lomotil to your pharmacy for delivery.  Stop your dicyclomine.  Let us know when your insurance issues are sorted out.  Follow up with [place] in 3 months please.  Thank you for choosing me and [facility].  Iva Boop, M.D., [facility]

## 2013-01-16 NOTE — Progress Notes (Signed)
Quick Note:  Labs ok except slightly low Hgb - mild anemia ______

## 2013-04-16 ENCOUNTER — Other Ambulatory Visit: Payer: Self-pay

## 2013-04-16 DIAGNOSIS — R197 Diarrhea, unspecified: Secondary | ICD-10-CM

## 2013-04-16 DIAGNOSIS — K5000 Crohn's disease of small intestine without complications: Secondary | ICD-10-CM

## 2013-04-16 MED ORDER — DIPHENOXYLATE-ATROPINE 2.5-0.025 MG PO TABS: 1.0000 | ORAL_TABLET | Freq: Three times a day (TID) | ORAL | Status: DC

## 2013-04-16 NOTE — Telephone Encounter (Signed)
Faxed generic Lomotil refill to [facility] in [place] fax # [phone].  Ok per Darcey Nora, [facility]

## 2013-05-12 ENCOUNTER — Encounter: Payer: Self-pay | Admitting: Internal Medicine

## 2013-05-12 ENCOUNTER — Ambulatory Visit (INDEPENDENT_AMBULATORY_CARE_PROVIDER_SITE_OTHER): Payer: Self-pay | Admitting: Internal Medicine

## 2013-05-12 VITALS — BP 100/70 | HR 100 | Ht 65.25 in | Wt 159.4 lb

## 2013-05-12 DIAGNOSIS — K5000 Crohn's disease of small intestine without complications: Secondary | ICD-10-CM

## 2013-05-12 DIAGNOSIS — D649 Anemia, unspecified: Secondary | ICD-10-CM

## 2013-05-12 DIAGNOSIS — K50018 Crohn's disease of small intestine with other complication: Secondary | ICD-10-CM

## 2013-05-12 MED ORDER — CYANOCOBALAMIN 1000 MCG PO TABS: 1000.0000 ug | ORAL_TABLET | Freq: Every day | ORAL | Status: DC

## 2013-05-12 NOTE — Assessment & Plan Note (Signed)
Symptomatically she is doing reasonably well but given how severe her problems were it would be appropriate to restart biologic agent now that she has insurance. She will come back for a PPD, we are unfortunately out of that right now but will notify her when that is in and I've asked her to send her insurance information to [place] so that we can work on prescribing Humira again. When she does return since she does have an anemia we'll check CBC as well. I have asked her to start vitamin B12 daily orally thousand micrograms. B12 level is okay in February of this year but she certainly is at risk of developing B12 deficiency due to her Crohn's resection of the ileum.

## 2013-05-12 NOTE — Progress Notes (Signed)
  Subjective:    Patient ID: Sheri Washington, female    DOB: 1965-12-11, 47 y.o.   MRN: [id number]  HPI Suhani presents for followup. I have prescribed Lomotil her last visit Controlling her symptoms quite well except for occasional loose stools and cramps. She now has insurance but does not have a right card/info with her today. She would like to restart Humira.  Medications, allergies, past medical history, past surgical history, family history and social history are reviewed and updated in the EMR.  Review of Systems As above    Objective:   Physical Exam General:  NAD Eyes:   anicteric Lungs:  clear Heart:  S1S2 no rubs, murmurs or gallops Abdomen:  soft and nontender, BS+, + scars Ext:   no edema     Assessment & Plan:

## 2013-05-12 NOTE — Assessment & Plan Note (Signed)
Hemoglobin was 10 last visit I had her start some iron. Her B12 level is okay. We will reassess this when she is able to come back with the proper insurance information later this week or next week.

## 2013-05-12 NOTE — Patient Instructions (Addendum)
You have been given a separate informational sheet regarding your tobacco use, the importance of quitting and local resources to help you quit.  Please get your up to date insurance information to [place], if you like you may fax it to [place] at # [phone].  We will contact you when we get the PPD test in.  If you get at the PCP office have results faxed to [place].  Once the insurance is all straight we will have Darcey Nora, [facility] , Dr. Marvell Fuller nurse check on Humira for you.  Please purchase B-12 1000 mcg. Over the counter and take one pill every day.  We will get blood work (CBC/diff) done once insurance is in.   Orders put in epic for future use.   I appreciate the opportunity to care for you.

## 2013-06-18 ENCOUNTER — Telehealth: Payer: Self-pay

## 2013-06-18 NOTE — Telephone Encounter (Signed)
Left message on her voice mail to call me back.  Need to see if she got her insurance issues cleared up so we can start biologics and also see if she has gotten a PPD at her  PCP or does she want to come here, currently we have it in stock.  See last office visit for more details.

## 2013-06-27 ENCOUNTER — Telehealth: Payer: Self-pay | Admitting: Internal Medicine

## 2013-06-27 DIAGNOSIS — R197 Diarrhea, unspecified: Secondary | ICD-10-CM

## 2013-06-27 NOTE — Telephone Encounter (Signed)
Left message on her voicemail to call me back. 

## 2013-06-30 ENCOUNTER — Other Ambulatory Visit: Payer: Self-pay

## 2013-06-30 DIAGNOSIS — R197 Diarrhea, unspecified: Secondary | ICD-10-CM

## 2013-06-30 MED ORDER — DIPHENOXYLATE-ATROPINE 2.5-0.025 MG PO TABS: 1.0000 | ORAL_TABLET | Freq: Three times a day (TID) | ORAL | Status: DC

## 2013-06-30 NOTE — Telephone Encounter (Signed)
Called generic Lomotil in to [facility] In [place] # [phone] , given verbally to Trey Paula in the pharmacy.

## 2013-06-30 NOTE — Telephone Encounter (Signed)
Spoke to patient, she needs Lomotil refill, I sent in a months worth.  She informs me still having insurance issues which her husband is trying to get worked out.  She is having oral surgery August 28th to have implants put in along with sinus surgery.  She is concerned about her immunity being down from the surgery and starting the humira, wants to know how long after her surgery would you advise proceeding with Humira.

## 2013-07-07 NOTE — Telephone Encounter (Signed)
Left voice mail to call me back

## 2013-07-07 NOTE — Telephone Encounter (Signed)
Spoke to patient and informed her what Dr. Leone Payor has advised.  She verbalized understanding and will be in touch with [place] post oral surgery.

## 2013-07-07 NOTE — Telephone Encounter (Signed)
2-4 weeks depending upon the healing process  Have her check in with [place] by phone 2 weeks after the surgery re: has she had fokllow-up, is she healing well

## 2013-08-05 ENCOUNTER — Telehealth: Payer: Self-pay | Admitting: Internal Medicine

## 2013-08-06 NOTE — Telephone Encounter (Signed)
Left message for patient to call back  

## 2013-08-07 MED ORDER — ADALIMUMAB 40 MG/0.8ML ~~LOC~~ KIT: PACK | SUBCUTANEOUS | Status: DC

## 2013-08-07 NOTE — Telephone Encounter (Signed)
Patient having cramping and diarrhea every few hours.  Patient will come in and see Doug Sou, PA tomorrow at 11:00.  Discussed with Dr. Leone Payor patient needs to resume Humira .  I will send RX and initiate prior auth.  She will need PPD skin test.  She will be provided a co-pay assistance card when she comes in also.

## 2013-08-08 ENCOUNTER — Ambulatory Visit: Payer: PRIVATE HEALTH INSURANCE | Admitting: Gastroenterology

## 2013-08-15 ENCOUNTER — Telehealth: Payer: Self-pay | Admitting: *Deleted

## 2013-08-15 DIAGNOSIS — R197 Diarrhea, unspecified: Secondary | ICD-10-CM

## 2013-08-15 MED ORDER — DIPHENOXYLATE-ATROPINE 2.5-0.025 MG PO TABS: 1.0000 | ORAL_TABLET | Freq: Three times a day (TID) | ORAL | Status: DC

## 2013-08-15 NOTE — Telephone Encounter (Signed)
Ok to refill lomotil 

## 2013-08-15 NOTE — Telephone Encounter (Signed)
Request for refill on patient's Lomotil, is it ok to refill?

## 2013-08-15 NOTE — Telephone Encounter (Signed)
I called patient and advised patient that we need to get her in the office for a follow up visit per Dr. Leone Payor. I advised patient that Dr. Leone Payor is concerned and would like to see her. Patient stated that she was feeling better that's why she did not come in to see Shanda Bumps.  I advised patient that I can send a refill on Lomotil but for further refills she needs to please keep the office appointment with Dr. Leone Payor.  Patient verbalized understanding.  Follow up appointment is for October 16 RX faxed

## 2013-08-15 NOTE — Telephone Encounter (Signed)
She cancelled appt w/ Shanda Bumps She needs to get back in to see [place] and get back on Humira Find out what she is doing Tell her I am very concerned she will end up back in the hospital again

## 2013-09-11 ENCOUNTER — Ambulatory Visit: Payer: Self-pay | Admitting: Internal Medicine

## 2014-03-16 ENCOUNTER — Telehealth: Payer: Self-pay

## 2014-03-16 MED ORDER — PROMETHAZINE HCL 12.5 MG PO TABS: 12.5000 mg | ORAL_TABLET | Freq: Three times a day (TID) | ORAL | Status: DC | PRN

## 2014-03-16 NOTE — Telephone Encounter (Signed)
Patient was informed that if she didn't hear back from me to check with her pharmacy later today.

## 2014-03-16 NOTE — Telephone Encounter (Signed)
Message copied by Sw[place]JORDAN, Shearon Clonch E on Mon Mar 16, 2014  3:50 PM ------      Message from: Stan HeadGESSNER, CARL E      Created: Mon Mar 16, 2014  3:36 PM       Refill x 1      ----- Message -----         From: Loralee Weitzman E Swazil[place]Jordan, CMA         Sent: 03/16/2014   2:41 PM           To: Iva Booparl E Gessner, MD            We got a refill request for her promethazine 12.5mg  today.  I spoke to her and she reports having some nausea lately from her GERD she thinks.  She is in the process of getting an appointment with you. May I refill her rx Sir?  Thank you for the advise.       ------

## 2014-04-13 ENCOUNTER — Telehealth: Payer: Self-pay | Admitting: Internal Medicine

## 2014-04-13 NOTE — Telephone Encounter (Signed)
Received records from [facility] /Dr. Allena KatzPatel.  Will put on Dr. Marvell FullerGessner's desk for review.

## 2014-04-13 NOTE — Telephone Encounter (Signed)
[place] to [place] in [place] a [place]. Allena KatzPatel.  He took her off of her Humira.  She is wanting to come back and see Dr. Leone PayorGessner. There are not records in her chart.  She is asked to get her records for us to have Dr. Leone PayorGessner review.  Dr. Leone PayorGessner have you seen anything? There is nothing in her chart.  Are you willing to see her back?

## 2014-04-15 NOTE — Telephone Encounter (Signed)
Left message for patient to call back Patient needs next available appt

## 2014-04-15 NOTE — Telephone Encounter (Signed)
I will see her again

## 2014-04-21 NOTE — Telephone Encounter (Signed)
Left message for patient to call back  

## 2014-04-22 NOTE — Telephone Encounter (Signed)
Appointment scheduled with patient for 06/22/14 11:30

## 2014-06-11 ENCOUNTER — Encounter: Payer: Self-pay | Admitting: Gastroenterology

## 2014-06-22 ENCOUNTER — Ambulatory Visit (INDEPENDENT_AMBULATORY_CARE_PROVIDER_SITE_OTHER): Payer: PRIVATE HEALTH INSURANCE | Admitting: Internal Medicine

## 2014-06-22 ENCOUNTER — Other Ambulatory Visit (INDEPENDENT_AMBULATORY_CARE_PROVIDER_SITE_OTHER): Payer: PRIVATE HEALTH INSURANCE

## 2014-06-22 ENCOUNTER — Encounter: Payer: Self-pay | Admitting: Internal Medicine

## 2014-06-22 VITALS — BP 118/84 | HR 88 | Ht 65.25 in | Wt 159.1 lb

## 2014-06-22 DIAGNOSIS — K5000 Crohn's disease of small intestine without complications: Secondary | ICD-10-CM

## 2014-06-22 DIAGNOSIS — R197 Diarrhea, unspecified: Secondary | ICD-10-CM

## 2014-06-22 DIAGNOSIS — K50019 Crohn's disease of small intestine with unspecified complications: Secondary | ICD-10-CM

## 2014-06-22 DIAGNOSIS — R1310 Dysphagia, unspecified: Secondary | ICD-10-CM | POA: Insufficient documentation

## 2014-06-22 DIAGNOSIS — K219 Gastro-esophageal reflux disease without esophagitis: Secondary | ICD-10-CM | POA: Insufficient documentation

## 2014-06-22 LAB — CBC WITH DIFFERENTIAL/PLATELET
Basophils Absolute: 0.1 10*3/uL (ref 0.0–0.1)
Basophils Relative: 0.5 % (ref 0.0–3.0)
Eosinophils Absolute: 0.2 10*3/uL (ref 0.0–0.7)
Eosinophils Relative: 1.4 % (ref 0.0–5.0)
HCT: 37.7 % (ref 36.0–46.0)
Hemoglobin: 12.8 g/dL (ref 12.0–15.0)
Lymphocytes Relative: 26.5 % (ref 12.0–46.0)
Lymphs Abs: 2.9 10*3/uL (ref 0.7–4.0)
MCHC: 34 g/dL (ref 30.0–36.0)
MCV: 89.2 fl (ref 78.0–100.0)
Monocytes Absolute: 0.4 10*3/uL (ref 0.1–1.0)
Monocytes Relative: 4 % (ref 3.0–12.0)
Neutro Abs: 7.4 10*3/uL (ref 1.4–7.7)
Neutrophils Relative %: 67.6 % (ref 43.0–77.0)
Platelets: 348 10*3/uL (ref 150.0–400.0)
RBC: 4.22 Mil/uL (ref 3.87–5.11)
RDW: 12.7 % (ref 11.5–15.5)
WBC: 11 10*3/uL — ABNORMAL HIGH (ref 4.0–10.5)

## 2014-06-22 LAB — COMPREHENSIVE METABOLIC PANEL
ALK PHOS: 49 U/L (ref 39–117)
ALT: 19 U/L (ref 0–35)
AST: 15 U/L (ref 0–37)
Albumin: 3.8 g/dL (ref 3.5–5.2)
BUN: 13 mg/dL (ref 6–23)
CO2: 26 mEq/L (ref 19–32)
CREATININE: 0.8 mg/dL (ref 0.4–1.2)
Calcium: 9.1 mg/dL (ref 8.4–10.5)
Chloride: 106 mEq/L (ref 96–112)
GFR: 81.55 mL/min (ref >60.00)
Glucose, Bld: 86 mg/dL (ref 70–99)
Potassium: 3.9 mEq/L (ref 3.5–5.1)
Sodium: 137 mEq/L (ref 135–145)
Total Bilirubin: 0.6 mg/dL (ref 0.2–1.2)
Total Protein: 7.1 g/dL (ref 6.0–8.3)

## 2014-06-22 LAB — VITAMIN B12: Vitamin B-12: 406 pg/mL (ref 211–911)

## 2014-06-22 LAB — VITAMIN D 25 HYDROXY (VIT D DEFICIENCY, FRACTURES): VITD: 41.74 ng/mL (ref 30.00–100.00)

## 2014-06-22 LAB — C-REACTIVE PROTEIN: CRP: 0.7 mg/dL (ref 0.5–20.0)

## 2014-06-22 MED ORDER — PANTOPRAZOLE SODIUM 40 MG PO TBEC: 40.0000 mg | DELAYED_RELEASE_TABLET | Freq: Every day | ORAL | Status: DC

## 2014-06-22 MED ORDER — BUDESONIDE 3 MG PO CP24: 9.0000 mg | ORAL_CAPSULE | Freq: Every day | ORAL | Status: DC

## 2014-06-22 MED ORDER — DIPHENOXYLATE-ATROPINE 2.5-0.025 MG PO TABS: 1.0000 | ORAL_TABLET | Freq: Three times a day (TID) | ORAL | Status: DC

## 2014-06-22 MED ORDER — BUDESONIDE 3 MG PO CP24: 3.0000 mg | ORAL_CAPSULE | Freq: Every day | ORAL | Status: DC

## 2014-06-22 NOTE — Assessment & Plan Note (Signed)
Cause of this is not clear. Upper endoscopy with possible esophageal dilation as appropriate. I suspect she has been cured I'm not sure what the swollen or inflamed lymph nodes were all about, she does not have any evidence of that now. The risks and benefits as well as alternatives of endoscopic procedure(s) have been discussed and reviewed. All questions answered. The patient agrees to proceed.

## 2014-06-22 NOTE — Assessment & Plan Note (Addendum)
Restart Humira him I think this is the best risk benefit ratio medication for her to prevent serious problems. 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 I have reviewed the risk benefit ratio of the Humira medication again and she consents to use that. Start Entocort for the time being as well. Consider the addition of a bile salt sequestrant. Lomotil before meals Recheck labs today, including a C-reactive, quantiferon ED test, vitamin D B12, cbcmetabolic panelBC

## 2014-06-22 NOTE — Progress Notes (Signed)
Subjective:    Patient ID: Sheri Washington, female    DOB: 03/28/1966, 48 y.o.   MRN: [id number]  HPI The patient returns, I had care for her before then she pursued care in [place] and asked to come back. She has Crohn's ileitis, initially manifested by abscess and obstruction requiring surgery. She was on Humira but lost her insurance and had to come off. She has been treated with mesalamine while away, under the care of another gastroenterologist at [place].  In the past several months she has had difficulty with dysphagia which persists though is better. She says she had enlarged lymph nodes in the neck area on the left, perhaps even the supraclavicular area, and was treated with antibiotics. She still has intermittent solid food dysphagia. She says she has a lot of heartburn and is using over-the-counter Prilosec frequently. There has been rare vomiting or regurgitation.  She also has an upper abdominal pain that radiates into the umbilical area and even into the rectal area. This tends to occur at night when she is trying to sleep. She has about 6-7 watery or loose stools a day frequently after meals that has lead her staying home. She says she is just kind of gotten used to that. She does not describe fever or bleeding.  She's also had kidney stones, and ovarian cyst problems in the last few months. No Known Allergies Outpatient Prescriptions Prior to Visit  Medication Sig Dispense Refill  . BIOTIN PO Take 1 capsule by mouth daily.       . divalproex (DEPAKOTE) 500 MG EC tablet Take 1,500 mg by mouth at bedtime.       Marland Kitchen levothyroxine (SYNTHROID, LEVOTHROID) 75 MCG tablet Take 75 mcg by mouth daily before breakfast.      . promethazine (PHENERGAN) 12.5 MG tablet Take 1 tablet (12.5 mg total) by mouth every 8 (eight) hours as needed for nausea or vomiting.  60 tablet  0  . sertraline (ZOLOFT) 100 MG tablet Take 200 mg by mouth daily.       . diphenoxylate-atropine (LOMOTIL) 2.5-0.025 MG  per tablet Take 1 tablet by mouth 3 (three) times daily before meals.  90 tablet  0  . adalimumab (HUMIRA PEN STARTER) 40 MG/0.8ML injection Inject 160 mg sq( 4 pens) , on day one, then day 15 inject 80 mg sq( 2 pens).  1 each  0  . adalimumab (HUMIRA PEN) 40 MG/0.8ML injection Inject 40 mg sq (1 pen) every other week after the completion of the starter kit.  1 each  6  . cyanocobalamin ([employer] VITAMIN B12) 1000 MCG tablet Take 1 tablet (1,000 mcg total) by mouth daily.      . ferrous sulfate dried (SLOW FE) 160 (50 FE) MG TBCR Take 160 mg by mouth daily.      Marland Kitchen OLANZapine (ZYPREXA) 5 MG tablet Take 10 mg by mouth at bedtime.       . promethazine (PHENERGAN) 25 MG tablet Take 1 tablet (25 mg total) by mouth every 6 (six) hours as needed for nausea.  30 tablet  0   No facility-administered medications prior to visit.   Past Medical History  Diagnosis Date  . Alcoholism   . Hypothyroidism   . Bipolar affective disorder   . Anemia   . Vitamin D deficiency   . Depression   . Crohn's disease of small intestine 2011    fistulizing, abscess, ileocolonic resection  . Foot fracture, left   . Diverticulosis   .  Hyperplastic rectal polyp    Past Surgical History  Procedure Laterality Date  . Tonsillectomy    . Foot surgery    . Hemicolectomy  06/17/10    Ileocecectomy w/ anastomosis and seperate small bowel resection w/ anastomosis  . Colonoscopy  08/23/2010, 2014    microscopic ileitis - Crohn's      Review of Systems As per history of present illness    Objective:   Physical Exam General:  Well-developed, well-nourished and in no acute distress Eyes:  anicteric. Neck:   supple w/o thyromegaly or mass.  Lungs: Clear to auscultation bilaterally. Heart:  S1S2, no rubs, murmurs, gallops. Abdomen:  soft, non-tender, no hepatosplenomegaly, hernia, or mass and BS+.  Rectal: Inspection shows normal anoderm without signs of fistula Lymph:  no cervical or supraclavicular adenopathy. No  axillary adenopathy either. Extremities:   no edema Skin   no rash. Neuro:  A&O x 3.  Psych:  Mild psychomotor agitation, talkative   Data Reviewed: Review of records from Dr. Posey Pronto in [place]. She had been treated with sulfasalazine and Anticort last year, other mesalamine agents have been used as well. Colonoscopy 09/17/2013 showed anastomotic ileal ulcers. MALT. No stricture. Biopsy showed focal active inflammation the pathologist did not think changes of Crohn's were present. She had a hyperplastic rectal polyp, she did not have colitis. October 2014 colonoscopy showed right ovarian cyst and postoperative changes question ileitis in the intestine. That last part is my interpretation from the report.  Wt Readings from Last 3 Encounters:  06/22/14 159 lb 2 oz (72.179 kg)  05/12/13 159 lb 6 oz (72.292 kg)  01/16/13 156 lb (70.761 kg)      Assessment & Plan:  CROHN'S DISEASE-SMALL INTESTINE Restart Humira him I think this is the best risk benefit ratio medication for her to prevent serious problems. 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 I have reviewed the risk benefit ratio of the Humira medication again and she consents to use that. Start Entocort for the time being as well. Consider the addition of a bile salt sequestrant. Lomotil before meals Recheck labs today, including a C-reactive, quantiferon ED test, vitamin D E76, cbcmetabolic panelBC   Dysphagia, unspecified(787.20) Cause of this is not clear. Upper endoscopy with possible esophageal dilation as appropriate. I suspect she has been cured I'm not sure what the swollen or inflamed lymph nodes were all about, she does not have any evidence of that now. The risks and benefits as well as alternatives of endoscopic procedure(s) have been discussed and reviewed. All questions answered. The patient agrees to proceed.    CC: Yvone Neu, MD

## 2014-06-22 NOTE — Patient Instructions (Addendum)
You have been given a separate informational sheet regarding your tobacco use, the importance of quitting and local resources to help you quit.  You have been scheduled for an endoscopy. Please follow written instructions given to you at your visit today. If you use inhalers (even only as needed), please bring them with you on the day of your procedure. Your physician has requested that you go to www.startemmi.com and enter the access code given to you at your visit today. This web site gives a general overview about your procedure. However, you should still follow specific instructions given to you by our office regarding your preparation for the procedure.  We have sent the following medications to your pharmacy for you to pick up at your convenience Entocort, Humira,pantoprazole   Today you have been given a printed rx for Lomotil to take to your pharmacy.  Your physician has requested that you go to the basement for lab work before leaving today.   I appreciate the opportunity to care for you.

## 2014-06-25 ENCOUNTER — Telehealth: Payer: Self-pay | Admitting: Internal Medicine

## 2014-06-25 ENCOUNTER — Other Ambulatory Visit: Payer: Self-pay

## 2014-06-25 LAB — QUANTIFERON TB GOLD ASSAY (BLOOD)
INTERFERON GAMMA RELEASE ASSAY: NEGATIVE
Mitogen value: 7.85 IU/mL
QUANTIFERON TB AG MINUS NIL: 0 [IU]/mL
Quantiferon Nil Value: 0.03 IU/mL
TB AG VALUE: 0.03 [IU]/mL

## 2014-06-25 MED ORDER — COLESTIPOL HCL 5 G PO PACK: 5.0000 g | PACK | ORAL | Status: DC

## 2014-06-25 NOTE — Telephone Encounter (Signed)
Abbvie wanted to confirm that I had received the prior auth form for Humira.  I confirmed we had and have sent it back to the insurance company

## 2014-06-25 NOTE — Progress Notes (Signed)
Quick Note:  Labs ok Have her start colestipol 5 grams daily with supper # 1 can with 11 refills This can help diarrhea  ______

## 2014-06-26 ENCOUNTER — Other Ambulatory Visit: Payer: Self-pay

## 2014-06-26 MED ORDER — CIMZIA 2 X 200 MG ~~LOC~~ KIT: PACK | SUBCUTANEOUS | Status: DC

## 2014-06-26 NOTE — Progress Notes (Signed)
Quick Note:  Cimzia is fine Lets rx that   ______

## 2014-07-07 ENCOUNTER — Telehealth: Payer: Self-pay | Admitting: Internal Medicine

## 2014-07-07 NOTE — Telephone Encounter (Signed)
Per Darcey NoraSheri Jones RN, CG-RN Chantix rx will need to come from her PCP.  Spoke with patient and she understands.

## 2014-07-14 ENCOUNTER — Ambulatory Visit (AMBULATORY_SURGERY_CENTER): Payer: PRIVATE HEALTH INSURANCE | Admitting: Internal Medicine

## 2014-07-14 ENCOUNTER — Encounter: Payer: Self-pay | Admitting: Internal Medicine

## 2014-07-14 VITALS — BP 129/76 | HR 95 | Temp 98.1°F | Resp 20 | Ht 65.25 in | Wt 159.0 lb

## 2014-07-14 DIAGNOSIS — R1310 Dysphagia, unspecified: Secondary | ICD-10-CM

## 2014-07-14 MED ORDER — SODIUM CHLORIDE 0.9 % IV SOLN: 500.0000 mL | INTRAVENOUS | Status: DC

## 2014-07-14 NOTE — Op Note (Signed)
[facility] [address] [place], [id number]   ENDOSCOPY PROCEDURE REPORT  PATIENT: [name], Sheri  MR#: [id number] BIRTHDATE: February 04, 1966 , 48  yrs. old GENDER: Female ENDOSCOPIST: Iva Booparl E Joy Reiger, MD, [facility] PROCEDURE DATE:  07/14/2014 PROCEDURE:  EGD, diagnostic ASA CLASS:     Class II INDICATIONS:  Dysphagia. MEDICATIONS: propofol (Diprivan) 150mg  IV, MAC sedation, administered by CRNA, and These medications were titrated to patient response per physician's verbal order TOPICAL ANESTHETIC: none  DESCRIPTION OF PROCEDURE: After the risks benefits and alternatives of the procedure were thoroughly explained, informed consent was obtained.  The LB [id number] endoscope was introduced through the mouth and advanced to the second portion of the duodenum. Without limitations.  The instrument was slowly withdrawn as the mucosa was fully examined.      The upper, middle and distal third of the esophagus were carefully inspected and no abnormalities were noted.  The z-line was well seen at the GEJ.  The endoscope was pushed into the fundus which was normal including a retroflexed view.  The antrum, gastric body, first and second part of the duodenum were unremarkable. Retroflexed views revealed no abnormalities.     The scope was then withdrawn from the patient and the procedure completed.  COMPLICATIONS: There were no complications. ENDOSCOPIC IMPRESSION: Normal EGD  RECOMMENDATIONS: 1.  Continue current medications 2.  See Dr.  Leone PayorGessner Sep 11 2014 1130 AM   eSigned:  Iva Booparl E Bjorn Hallas, MD, [facility] 07/14/2014 1:35 PM   CC:The Patient  and Linna Darneravid Hungarland, MD

## 2014-07-14 NOTE — Progress Notes (Signed)
A/ox3, pleased with MAC, report to RN 

## 2014-07-14 NOTE — Patient Instructions (Addendum)
The endoscopy looks ok. Let's see how things go since they are getting better.  I made you an appointment to see me for follow-up September 11, 2014 at  1130 AM.  I appreciate the opportunity to care for you. Iva Boop, MD, FACG   YOU HAD AN ENDOSCOPIC PROCEDURE TODAY AT THE [facility]: Refer to the procedure report that was given to you for any specific questions about what was found during the examination.  If the procedure report does not answer your questions, please call your gastroenterologist to clarify.  If you requested that your care partner not be given the details of your procedure findings, then the procedure report has been included in a sealed envelope for you to review at your convenience later.  YOU SHOULD EXPECT: Some feelings of bloating in the abdomen. Passage of more gas than usual.  Walking can help get rid of the air that was put into your GI tract during the procedure and reduce the bloating. If you had a lower endoscopy (such as a colonoscopy or flexible sigmoidoscopy) you may notice spotting of blood in your stool or on the toilet paper. If you underwent a bowel prep for your procedure, then you may not have a normal bowel movement for a few days.  DIET: Your first meal following the procedure should be a light meal and then it is ok to progress to your normal diet.  A half-sandwich or bowl of soup is an example of a good first meal.  Heavy or fried foods are harder to digest and may make you feel nauseous or bloated.  Likewise meals heavy in dairy and vegetables can cause extra gas to form and this can also increase the bloating.  Drink plenty of fluids but you should avoid alcoholic beverages for 24 hours.  ACTIVITY: Your care partner should take you home directly after the procedure.  You should plan to take it easy, moving slowly for the rest of the day.  You can resume normal activity the day after the procedure however you should NOT DRIVE or use heavy  machinery for 24 hours (because of the sedation medicines used during the test).    SYMPTOMS TO REPORT IMMEDIATELY: A gastroenterologist can be reached at any hour.  During normal business hours, 8:30 AM to 5:00 PM Monday through Friday, call [phone].  After hours and on weekends, please call the GI answering service at [phone] who will take a message and have the physician on call contact you.     Following upper endoscopy (EGD)  Vomiting of blood or coffee ground material  New chest pain or pain under the shoulder blades  Painful or persistently difficult swallowing  New shortness of breath  Fever of 100F or higher  Black, tarry-looking stools  FOLLOW UP: If any biopsies were taken you will be contacted by phone or by letter within the next 1-3 weeks.  Call your gastroenterologist if you have not heard about the biopsies in 3 weeks.  Our staff will call the home number listed on your records the next business day following your procedure to check on you and address any questions or concerns that you may have at that time regarding the information given to you following your procedure. This is a courtesy call and so if there is no answer at the home number and we have not heard from you through the emergency physician on call, we will assume that you have returned to your  regular daily activities without incident.  SIGNATURES/CONFIDENTIALITY: You and/or your care partner have signed paperwork which will be entered into your electronic medical record.  These signatures attest to the fact that that the information above on your After Visit Summary has been reviewed and is understood.  Full responsibility of the confidentiality of this discharge information lies with you and/or your care-partner.

## 2014-07-15 ENCOUNTER — Telehealth: Payer: Self-pay | Admitting: *Deleted

## 2014-07-15 NOTE — Telephone Encounter (Signed)
  Follow up Call-  Call back number 07/14/2014  Post procedure Call Back phone  # [phone]  Permission to leave phone message Yes    No answer,left message.

## 2014-07-20 ENCOUNTER — Telehealth: Payer: Self-pay | Admitting: Internal Medicine

## 2014-07-20 DIAGNOSIS — R197 Diarrhea, unspecified: Secondary | ICD-10-CM

## 2014-07-20 MED ORDER — DIPHENOXYLATE-ATROPINE 2.5-0.025 MG PO TABS: 1.0000 | ORAL_TABLET | Freq: Three times a day (TID) | ORAL | Status: DC

## 2014-07-20 NOTE — Telephone Encounter (Signed)
I would like her to try the colestipol She may not need Lomotil if she takes it  We can refill the Lomotil

## 2014-07-20 NOTE — Telephone Encounter (Signed)
We sent in colestipol on 06/25/14 for a year's worth.  Per Trey Paula at her [facility] she told them to put it on hold.  Today she's asking me if she is suppose to be taking that, please advise Sir.  She is also requesting a refill on her Lomotil which she said is working.  She is having diarrhea on average twice a day now which she said is good for her.  Reports no fever.  Some nausea this AM which she said she has phenergan for.  She is still waiting for her cimzia she said, she must have meant Humira which is what is documented that Darcey Nora, RN, CGRN is working on for her.

## 2014-07-20 NOTE — Telephone Encounter (Signed)
She is taking one Lomotil three times a day.  She has a week or so left.  She said she was asking early because she thought she had to come pick it up.  I told her if you ok the refill I can fax it in.

## 2014-07-20 NOTE — Telephone Encounter (Signed)
I thought she had started the colestipol  1) how much Lomotil is she taking 2) after I know I will advise further

## 2014-07-20 NOTE — Telephone Encounter (Signed)
Left message on her voicemail to call me back. 

## 2014-07-20 NOTE — Telephone Encounter (Signed)
Spoke to patient and she said she will try the colestipol.  If it " stops her up" to bad she will call us back to adjust dosage.  I faxed the printed rx for [facility] as she [facility].  She was informed she may not need the lomotil once she takes the colestipol.

## 2014-08-10 ENCOUNTER — Telehealth: Payer: Self-pay | Admitting: Internal Medicine

## 2014-08-10 DIAGNOSIS — M791 Myalgia, unspecified site: Secondary | ICD-10-CM

## 2014-08-10 NOTE — Telephone Encounter (Signed)
Left message for patient to call back  

## 2014-08-10 NOTE — Telephone Encounter (Signed)
Returned your call.

## 2014-08-11 NOTE — Telephone Encounter (Signed)
Let's hold Cimzia until we know more

## 2014-08-11 NOTE — Telephone Encounter (Signed)
Patient reports that she is having "muscle soreness" after Cimzia injections.  "feels like I have been working out, sore all over".  She took her injection on Sunday night and is just starting to feel better. She has had the same reaction with each injection.  Is it ok for her to continue Cimzia with benadryl prior??  I have a phone call into cimzia asking for recommendations with theses side effects.

## 2014-08-13 MED ORDER — CELECOXIB 200 MG PO CAPS: 200.0000 mg | ORAL_CAPSULE | Freq: Two times a day (BID) | ORAL | Status: DC

## 2014-08-13 NOTE — Telephone Encounter (Signed)
I spoke with Cimzia nursing support.  I need to speak with [facility]  Extensive wait times.  i will continue to try and reach them [phone]

## 2014-08-13 NOTE — Telephone Encounter (Signed)
Patient notified  RX sent Her soreness has resolved.  She will come for labs after next injection if she develops pain

## 2014-08-13 NOTE — Telephone Encounter (Signed)
I spoke with [facility].  They have no information on Muscle pain or advise about it.  They do have reports of patients developing arthralgias over time.  They "defer to the prescribing physician".  Please advise if ok to continue?

## 2014-08-13 NOTE — Telephone Encounter (Signed)
Have her take it again  She should get an AST, ALT and total CK within a few days of taking the Cimzia (looking for muscle inflammation) She can wean off the budesonide (Entocort) if diarrhea ok celebrex 200 mg qd prn # 30 for the pains See me 10/16 as planned

## 2014-08-13 NOTE — Telephone Encounter (Signed)
Left message for patient to call back  

## 2014-08-26 ENCOUNTER — Other Ambulatory Visit: Payer: Self-pay

## 2014-08-26 DIAGNOSIS — R197 Diarrhea, unspecified: Secondary | ICD-10-CM

## 2014-08-26 MED ORDER — DIPHENOXYLATE-ATROPINE 2.5-0.025 MG PO TABS: 1.0000 | ORAL_TABLET | Freq: Three times a day (TID) | ORAL | Status: DC

## 2014-09-02 ENCOUNTER — Telehealth: Payer: Self-pay | Admitting: Internal Medicine

## 2014-09-02 MED ORDER — PROMETHAZINE HCL 12.5 MG PO TABS: 12.5000 mg | ORAL_TABLET | Freq: Three times a day (TID) | ORAL | Status: DC | PRN

## 2014-09-02 NOTE — Telephone Encounter (Signed)
Patient reports nausea that developed on Sunday and vomiting that started on Monday.  She reports that the last time she had vomiting was this am.  She is not able to tolerate a solid diet but able to keep fluids down..  Having dry heaves this am, but currently not having any symptoms  She denies abdominal pain, fever, constipation , or diarrhea.  Her last Cimzia dose was 08/21/14 not due another until 09/20/14.  She has some abdominal bloating today.   She took PCN for a tooth infection and finished this last Thursday. I have sent her in a refill of her promethazine.  She will try and push fluids tonight.  She will call back if her symptoms worsen.  She agrees with this plan .  Dr. Leone PayorGessner please advise

## 2014-09-02 NOTE — Telephone Encounter (Signed)
Left message for patient to call back  

## 2014-09-03 NOTE — Telephone Encounter (Signed)
Agree with plan - see if self-limited or not

## 2014-09-11 ENCOUNTER — Ambulatory Visit (INDEPENDENT_AMBULATORY_CARE_PROVIDER_SITE_OTHER): Payer: PRIVATE HEALTH INSURANCE | Admitting: Internal Medicine

## 2014-09-11 ENCOUNTER — Encounter: Payer: Self-pay | Admitting: Internal Medicine

## 2014-09-11 VITALS — BP 110/60 | HR 108 | Ht 65.25 in | Wt 169.4 lb

## 2014-09-11 DIAGNOSIS — D849 Immunodeficiency, unspecified: Secondary | ICD-10-CM

## 2014-09-11 DIAGNOSIS — D899 Disorder involving the immune mechanism, unspecified: Secondary | ICD-10-CM

## 2014-09-11 DIAGNOSIS — K219 Gastro-esophageal reflux disease without esophagitis: Secondary | ICD-10-CM

## 2014-09-11 DIAGNOSIS — K50019 Crohn's disease of small intestine with unspecified complications: Secondary | ICD-10-CM

## 2014-09-11 NOTE — Patient Instructions (Signed)
   Glad things are going well overall. Please get a flu shot soon as we discussed. Killed vaccines are ok, live vaccines are not.  I appreciate the opportunity to care for you. Iva Booparl E. Gessner, MD, [facility]FACG

## 2014-09-11 NOTE — Assessment & Plan Note (Addendum)
Flu shot next week, she is traveling this weekend and did not want to risk the chance of a reaction so she will pursue this at home.

## 2014-09-11 NOTE — Assessment & Plan Note (Addendum)
Improved and asymptomatic on pantoprazole 40 mg daily. We'll continue.

## 2014-09-11 NOTE — Progress Notes (Signed)
   Subjective:    Patient ID: Sheri Washington, female    DOB: 1966/01/10, 48 y.o.   MRN: [id number]  HPI Sheri NimrodMeredith returns doing well. She is concerned about some weight gain. Her psychiatrist is changing some of her medications. Last week he had some spell of nausea and vomiting that was self-limited. She has been having aches and pains for 1-2 days after her Cimzia injections. She can tolerate this now this only once a month and she takes her Cimzia on Friday since she's able to make it a church on Sundays. She feels like her Crohn's is under good control without diarrhea or abdominal pain for the most part. Sometimes in the mornings she has a right lower quadrant or groin pain is transient, that occurs as she is banding in getting out of bed. Overall she is improved. She reports that pantoprazole controlling her heartburn and the mild dysphagia she was having she is asymptomatic on that. Medications, allergies, past medical history, past surgical history, family history and social history are reviewed and updated in the EMR.   Review of Systems As per history of present illness    Objective:   Physical Exam General:  NAD Eyes:   anicteric Lungs:  clear Heart:  S1S2 no rubs, murmurs or gallops Abdomen:  soft and nontender, BS+ Ext:   no edema    Assessment & Plan:  CROHN'S DISEASE-SMALL INTESTINE improved on Cimzia, she will continue this. Arthralgias are known possible side effects, she says she can tolerate it without medication and that seems to be getting better. I don't think she's had a DEXA scan, that is something to consider in her case. 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  GERD (gastroesophageal reflux disease) Improved and asymptomatic on pantoprazole 40 mg daily. We'll continue.   Immunosuppressed on Humira Flu shot next week, she is traveling this weekend and did not  want to risk the chance of a reaction so she will pursue this at home.   CC: Maximiano CossHUNGARLAND,JOHN DAVID, MD

## 2014-09-11 NOTE — Assessment & Plan Note (Addendum)
improved on Cimzia, she will continue this. Arthralgias are known possible side effects, she says she can tolerate it without medication and that seems to be getting better. I don't think she's had a DEXA scan, that is something to consider in her case. 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

## 2014-09-24 ENCOUNTER — Telehealth: Payer: Self-pay | Admitting: Internal Medicine

## 2014-09-24 DIAGNOSIS — R197 Diarrhea, unspecified: Secondary | ICD-10-CM

## 2014-09-24 MED ORDER — DIPHENOXYLATE-ATROPINE 2.5-0.025 MG PO TABS: 1.0000 | ORAL_TABLET | Freq: Three times a day (TID) | ORAL | Status: DC

## 2014-09-24 NOTE — Telephone Encounter (Signed)
Seen Oct. 16th 2015.  How many refills Sir?

## 2014-09-24 NOTE — Telephone Encounter (Signed)
Called refill in and also sent it thru computer.

## 2014-09-24 NOTE — Telephone Encounter (Signed)
6 

## 2014-11-02 ENCOUNTER — Telehealth: Payer: Self-pay | Admitting: Internal Medicine

## 2014-11-02 NOTE — Telephone Encounter (Signed)
Stop colestipol also

## 2014-11-02 NOTE — Telephone Encounter (Signed)
I left a detailed message with Dr. Marvell FullerGessner's recommendations.  She is asked to call back for additional questions or concerns

## 2014-11-02 NOTE — Telephone Encounter (Signed)
Patient reports a 5 days history of " terrible" constipation and abdominal pain. She reports "I almost passed out" from trying to have a BM.   She had to use an enema on Saturday with minimal relief.  She feels she is still very constipated.  She is advised to start on Miralax TID until she has relief then titrate daily for constipation.  She will call me back if she is still having symptoms in the next day or so.  She agrees to this plan.

## 2014-12-03 ENCOUNTER — Telehealth: Payer: Self-pay | Admitting: Internal Medicine

## 2014-12-03 NOTE — Telephone Encounter (Signed)
This sounds like she has strained abdominal wall and not GI - when did it start and occur (past week, or just today?) Has she been lifting, coughing, vomiting? Did she injure or twist something

## 2014-12-03 NOTE — Telephone Encounter (Signed)
Patient report 3 episodes of abdominal cramping "paralyzing".  Lasts about 30 seconds, after she has "gotten up or turn in bed".  She denies any diarrhea, constipation, vomiting.  She did have some nausea with one episode.

## 2014-12-03 NOTE — Telephone Encounter (Signed)
Noticed pain for a few weeks, but much worse since yesterday No injury, coughing, vomiting

## 2014-12-03 NOTE — Telephone Encounter (Signed)
She will need an office evaluation to sort this out more

## 2014-12-04 NOTE — Telephone Encounter (Signed)
Patient is scheduled for 12/28/14.

## 2014-12-24 ENCOUNTER — Telehealth: Payer: Self-pay | Admitting: Obstetrics and Gynecology

## 2014-12-24 NOTE — Telephone Encounter (Signed)
Called patient and left a message to schedule a new patient doctor referral.

## 2014-12-28 ENCOUNTER — Ambulatory Visit (INDEPENDENT_AMBULATORY_CARE_PROVIDER_SITE_OTHER): Payer: PRIVATE HEALTH INSURANCE | Admitting: Internal Medicine

## 2014-12-28 ENCOUNTER — Encounter: Payer: Self-pay | Admitting: Internal Medicine

## 2014-12-28 VITALS — BP 120/80 | HR 92 | Ht 65.25 in | Wt 157.4 lb

## 2014-12-28 DIAGNOSIS — K5000 Crohn's disease of small intestine without complications: Secondary | ICD-10-CM

## 2014-12-28 DIAGNOSIS — R1031 Right lower quadrant pain: Secondary | ICD-10-CM

## 2014-12-28 NOTE — Assessment & Plan Note (Addendum)
Off Cimzia Wants to retry Humira Will see if she can obtain that. RLQ pain could be related to Crohn's but seems unlikely to me. History does not seem to support GI pain. Await pelvic [place]

## 2014-12-28 NOTE — Telephone Encounter (Signed)
Left message to call back  

## 2014-12-28 NOTE — Patient Instructions (Addendum)
Dr. Leone PayorGessner would like to see what your  GYN U/S results are before rx'ing Humira.   I appreciate the opportunity to care for you. Stan Headarl Gessner, M.D., [facility]

## 2014-12-28 NOTE — Progress Notes (Signed)
   Subjective:    Patient ID: Sheri Washington, female    DOB: January 11, 1966, 49 y.o.   MRN: [id number]  HPI The patient is here for f/u of Crohn's ileitis and also RLQ pain problems.  2-3 weeks ago had severe RLQ pain, occurred when rolling to dise, was sharp and intense and unbearable. She has since seen GYN in [place] and was told she had dysmenorrhea. Says she will have a pelvic US this week.  Has had some constipation - stopped colestipol. Not constipated now. She stopped Cimzia in Dec because it was going to be $1000/month. Also was having arthralgias and did not feel well x 2-3 days after taking. Says she feels much better overall off that. Wants to know if she can go back on Humira.  Now seeing [place] in [place] and medications have been stopped and changed. She feels better and less side effects overall. Dx of bipolar d/o has been questioned it seems.   No Known Allergies    Current outpatient prescriptions:  .  BIOTIN PO, Take 1 capsule by mouth daily. , Disp: , Rfl:  .  Cholecalciferol (VITAMIN D) 2000 UNITS CAPS, Take 1 capsule by mouth daily., Disp: , Rfl:  .  diphenoxylate-atropine (LOMOTIL) 2.5-0.025 MG per tablet, Take 1 tablet by mouth 4 (four) times daily -  before meals and at bedtime., Disp: 120 tablet, Rfl: 5 .  lamoTRIgine (LAMICTAL) 25 MG tablet, Take 100 mg by mouth daily. , Disp: , Rfl:  .  levothyroxine (SYNTHROID, LEVOTHROID) 75 MCG tablet, Take 75 mcg by mouth daily before breakfast., Disp: , Rfl:  .  pantoprazole (PROTONIX) 40 MG tablet, Take 1 tablet (40 mg total) by mouth daily before breakfast., Disp: 30 tablet, Rfl: 11 .  promethazine (PHENERGAN) 12.5 MG tablet, Take 1 tablet (12.5 mg total) by mouth every 8 (eight) hours as needed for nausea or vomiting., Disp: 30 tablet, Rfl: 0 .  sertraline (ZOLOFT) 100 MG tablet, Take 100 mg by mouth daily. , Disp: , Rfl:    Past Medical History  Diagnosis Date  . Alcoholism   . Hypothyroidism   .  Bipolar affective disorder   . Anemia   . Vitamin D deficiency   . Depression   . Crohn's disease of small intestine 2011    fistulizing, abscess, ileocolonic resection  . Foot fracture, left    Past Surgical History  Procedure Laterality Date  . Tonsillectomy    . Foot surgery    . Hemicolectomy  06/17/10    Ileocecectomy w/ anastomosis and seperate small bowel resection w/ anastomosis  . Colonoscopy  08/23/2010, 2014    microscopic ileitis - Crohn's    Review of Systems As above    Objective:   Physical Exam BP 120/80 mmHg  Pulse 92  Ht 5' 5.25" (1.657 m)  Wt 157 lb 6 oz (71.385 kg)  BMI 26.00 kg/m2 WDWN NAD Talkative today. Appropriate mood and affect otherwise.  Eyes are anicteric Abdomen is soft BS + and nontender w/o organomegaly or mass     Assessment & Plan:  RLQ abdominal pain  Regional enteritis of small intestine, without complications  CROHN'S DISEASE-SMALL INTESTINE Off Cimzia Wants to retry Humira Will see if she can obtain that. RLQ pain could be related to Crohn's but seems unlikely to me. History does not seem to support GI pain. Await pelvic US    JY:[id number],QMVHCc:HUNGARLAND,JOHN DAVID, MD Bary LericheElizabeth Bullard, MD [address] [place], [place] [id number]

## 2014-12-29 NOTE — Telephone Encounter (Signed)
Left message to call back  

## 2014-12-30 NOTE — Telephone Encounter (Signed)
Left patient a message to call back to clarify whether she has an [facility] [place] [facility] stated or whether she will be coming here.

## 2015-01-11 ENCOUNTER — Encounter: Payer: Self-pay | Admitting: Obstetrics and Gynecology

## 2015-01-18 ENCOUNTER — Encounter: Payer: Self-pay | Admitting: Obstetrics and Gynecology

## 2015-01-25 ENCOUNTER — Encounter: Payer: Self-pay | Admitting: Obstetrics and Gynecology

## 2015-02-15 ENCOUNTER — Other Ambulatory Visit: Payer: Self-pay | Admitting: Internal Medicine

## 2015-02-15 ENCOUNTER — Telehealth: Payer: Self-pay | Admitting: Internal Medicine

## 2015-02-15 MED ORDER — PROMETHAZINE HCL 12.5 MG PO TABS: 12.5000 mg | ORAL_TABLET | Freq: Three times a day (TID) | ORAL | Status: DC | PRN

## 2015-02-15 NOTE — Telephone Encounter (Signed)
Rx sent and patient informed.  

## 2015-02-15 NOTE — Progress Notes (Signed)
[facility], [facility]  Patient informed that rx sent.

## 2015-02-15 NOTE — Telephone Encounter (Signed)
Order pended and routed.

## 2015-02-15 NOTE — Telephone Encounter (Signed)
Please advise Sir? 

## 2015-04-14 ENCOUNTER — Telehealth: Payer: Self-pay

## 2015-04-14 DIAGNOSIS — R197 Diarrhea, unspecified: Secondary | ICD-10-CM

## 2015-04-14 MED ORDER — DIPHENOXYLATE-ATROPINE 2.5-0.025 MG PO TABS: 1.0000 | ORAL_TABLET | Freq: Three times a day (TID) | ORAL | Status: DC

## 2015-04-14 NOTE — Telephone Encounter (Signed)
X 2 I need to see her in June/July

## 2015-04-14 NOTE — Telephone Encounter (Signed)
Rx faxed to [facility] [place] along with a note for her to call and set up f/u appointment.

## 2015-04-14 NOTE — Telephone Encounter (Signed)
Received [facility] from [facility] [place] for patient's Lomotil.  Will route to Dr. Leone PayorGessner to advise on number of refills as it is a controlled substance.

## 2015-05-25 ENCOUNTER — Encounter: Payer: Self-pay | Admitting: Internal Medicine

## 2015-07-12 ENCOUNTER — Other Ambulatory Visit: Payer: Self-pay

## 2015-07-12 MED ORDER — PANTOPRAZOLE SODIUM 40 MG PO TBEC: 40.0000 mg | DELAYED_RELEASE_TABLET | Freq: Every day | ORAL | Status: DC

## 2015-08-09 ENCOUNTER — Ambulatory Visit (INDEPENDENT_AMBULATORY_CARE_PROVIDER_SITE_OTHER): Payer: PRIVATE HEALTH INSURANCE | Admitting: Internal Medicine

## 2015-08-09 ENCOUNTER — Encounter: Payer: Self-pay | Admitting: Internal Medicine

## 2015-08-09 ENCOUNTER — Other Ambulatory Visit (INDEPENDENT_AMBULATORY_CARE_PROVIDER_SITE_OTHER): Payer: PRIVATE HEALTH INSURANCE

## 2015-08-09 VITALS — BP 114/76 | HR 96 | Ht 65.25 in | Wt 146.5 lb

## 2015-08-09 DIAGNOSIS — R197 Diarrhea, unspecified: Secondary | ICD-10-CM

## 2015-08-09 DIAGNOSIS — K5000 Crohn's disease of small intestine without complications: Secondary | ICD-10-CM | POA: Diagnosis not present

## 2015-08-09 LAB — CBC WITH DIFFERENTIAL/PLATELET
Basophils Absolute: 0 10*3/uL (ref 0.0–0.1)
Basophils Relative: 0.4 % (ref 0.0–3.0)
EOS PCT: 0.9 % (ref 0.0–5.0)
Eosinophils Absolute: 0.1 10*3/uL (ref 0.0–0.7)
HCT: 39.3 % (ref 36.0–46.0)
HEMOGLOBIN: 13.2 g/dL (ref 12.0–15.0)
LYMPHS PCT: 14.6 % (ref 12.0–46.0)
Lymphs Abs: 1.6 10*3/uL (ref 0.7–4.0)
MCHC: 33.6 g/dL (ref 30.0–36.0)
MCV: 93.1 fl (ref 78.0–100.0)
MONOS PCT: 4.9 % (ref 3.0–12.0)
Monocytes Absolute: 0.5 10*3/uL (ref 0.1–1.0)
Neutro Abs: 8.8 10*3/uL — ABNORMAL HIGH (ref 1.4–7.7)
Neutrophils Relative %: 79.2 % — ABNORMAL HIGH (ref 43.0–77.0)
Platelets: 307 10*3/uL (ref 150.0–400.0)
RBC: 4.22 Mil/uL (ref 3.87–5.11)
RDW: 13.8 % (ref 11.5–15.5)
WBC: 11.1 10*3/uL — AB (ref 4.0–10.5)

## 2015-08-09 LAB — COMPREHENSIVE METABOLIC PANEL
ALK PHOS: 71 U/L (ref 39–117)
ALT: 27 U/L (ref 0–35)
AST: 19 U/L (ref 0–37)
Albumin: 4.1 g/dL (ref 3.5–5.2)
BILIRUBIN TOTAL: 0.4 mg/dL (ref 0.2–1.2)
BUN: 14 mg/dL (ref 6–23)
CALCIUM: 8.9 mg/dL (ref 8.4–10.5)
CO2: 25 mEq/L (ref 19–32)
CREATININE: 1.42 mg/dL — AB (ref 0.40–1.20)
Chloride: 103 mEq/L (ref 96–112)
GFR: 41.86 mL/min — AB (ref >60.00)
GLUCOSE: 97 mg/dL (ref 70–99)
Potassium: 3.8 mEq/L (ref 3.5–5.1)
Sodium: 138 mEq/L (ref 135–145)
TOTAL PROTEIN: 7.2 g/dL (ref 6.0–8.3)

## 2015-08-09 LAB — C-REACTIVE PROTEIN: CRP: 0.5 mg/dL (ref 0.5–20.0)

## 2015-08-09 LAB — VITAMIN B12: VITAMIN B 12: 338 pg/mL (ref 211–911)

## 2015-08-09 MED ORDER — DIPHENOXYLATE-ATROPINE 2.5-0.025 MG PO TABS: 1.0000 | ORAL_TABLET | Freq: Four times a day (QID) | ORAL | Status: DC | PRN

## 2015-08-09 NOTE — Assessment & Plan Note (Signed)
Labs today Restart Humira Refill Lomotil

## 2015-08-09 NOTE — Progress Notes (Signed)
   Subjective:    Patient ID: Sheri Washington, female    DOB: 02-08-1966, 49 y.o.   MRN: [id number] Cc: abdominal pain, diarrhea, Crohn's HPI Stiil w/ some RLQ pains and diarrhea  Wants to try Humira  Felt terrible on Cimzia w/ aches and pains  Lomotil helps hen travelling - refill please Review of Systems Menopausal w/ menorrhagia - recent endometrial bx ok Has IUD Merina for cycle regulaion    Objective:   Physical Exam  114/76 mmHg  Pulse 96  Ht 5' 5.25" (1.657 m)  Wt 146 lb 8 oz (66.452 kg)  BMI 24.20 kg/m2  LMP 07/26/2015 (Approximate)@  General:  NAD Eyes:   anicteric Lungs:  clear Heart:: S1S2 no rubs, murmurs or gallops Abdomen:  soft and nontender, BS+ Ext:   no edema, cyanosis or clubbing     Assessment & Plan:  CROHN'S DISEASE-SMALL INTESTINE Labs today Restart Humira Refill Lomotil   15 minutes time spent with patient > half in counseling coordination of care

## 2015-08-09 NOTE — Patient Instructions (Addendum)
Your physician has requested that you go to the basement for the lab work before leaving today.   We have sent the following medications to your pharmacy for you to pick up at your convenience: [facility] [place]   I appreciate the opportunity to care for you. Stan Head, MD, [facility]

## 2015-08-10 ENCOUNTER — Other Ambulatory Visit: Payer: Self-pay

## 2015-08-10 DIAGNOSIS — R7989 Other specified abnormal findings of blood chemistry: Secondary | ICD-10-CM

## 2015-08-10 LAB — QUANTIFERON TB GOLD ASSAY (BLOOD)
INTERFERON GAMMA RELEASE ASSAY: NEGATIVE
MITOGEN VALUE: 4.58 [IU]/mL
QUANTIFERON NIL VALUE: 0.06 [IU]/mL
QUANTIFERON TB AG MINUS NIL: -0.01 [IU]/mL
TB Ag value: 0.05 IU/mL

## 2015-08-10 MED ORDER — ADALIMUMAB 40 MG/0.8ML ~~LOC~~ AJKT: 160.0000 mg | AUTO-INJECTOR | Freq: Once | SUBCUTANEOUS | Status: DC

## 2015-08-10 MED ORDER — ADALIMUMAB 40 MG/0.8ML ~~LOC~~ AJKT: 40.0000 mg | AUTO-INJECTOR | SUBCUTANEOUS | Status: DC

## 2015-08-10 NOTE — Progress Notes (Signed)
Quick Note:  Labs ok except mild increase creatinine She wants to try Humira again so please Rx starter kit and dose normally See me 3 months Needs BMEt again in 1 month re abnormal creatinine ______

## 2015-10-29 ENCOUNTER — Ambulatory Visit: Payer: PRIVATE HEALTH INSURANCE | Admitting: Internal Medicine

## 2015-11-25 ENCOUNTER — Telehealth: Payer: Self-pay | Admitting: Internal Medicine

## 2015-11-25 MED ORDER — PANTOPRAZOLE SODIUM 40 MG PO TBEC: 40.0000 mg | DELAYED_RELEASE_TABLET | Freq: Every day | ORAL | Status: DC

## 2015-11-25 NOTE — Telephone Encounter (Signed)
Refill sent in as requested, seen in Sept. 2016.

## 2015-12-10 ENCOUNTER — Telehealth: Payer: Self-pay

## 2015-12-10 MED ORDER — DIPHENOXYLATE-ATROPINE 2.5-0.025 MG PO TABS
1.0000 | ORAL_TABLET | Freq: Four times a day (QID) | ORAL | Status: DC | PRN
Start: 2015-12-10 — End: 2016-06-29

## 2015-12-10 NOTE — Telephone Encounter (Signed)
Received request for Lomotil refill , will route to Dr Leone PayorGessner to advise.

## 2015-12-10 NOTE — Telephone Encounter (Signed)
Ok to refill x 1 needs rev Was supposed to get  A BMEt and has not

## 2015-12-10 NOTE — Telephone Encounter (Signed)
Left patient detailed message to call and make appointment and to come and get BMET drawn.  Faxed the printed rx for Lomotil to [facility].

## 2016-02-08 ENCOUNTER — Ambulatory Visit: Payer: PRIVATE HEALTH INSURANCE | Admitting: Internal Medicine

## 2016-06-29 ENCOUNTER — Other Ambulatory Visit (INDEPENDENT_AMBULATORY_CARE_PROVIDER_SITE_OTHER): Payer: Managed Care, Other (non HMO)

## 2016-06-29 ENCOUNTER — Encounter: Payer: Self-pay | Admitting: Gastroenterology

## 2016-06-29 ENCOUNTER — Ambulatory Visit (INDEPENDENT_AMBULATORY_CARE_PROVIDER_SITE_OTHER): Payer: Managed Care, Other (non HMO) | Admitting: Gastroenterology

## 2016-06-29 VITALS — BP 128/80 | HR 90 | Ht 65.5 in | Wt 144.4 lb

## 2016-06-29 DIAGNOSIS — K5000 Crohn's disease of small intestine without complications: Secondary | ICD-10-CM

## 2016-06-29 LAB — COMPREHENSIVE METABOLIC PANEL
ALT: 68 U/L — ABNORMAL HIGH (ref 0–35)
AST: 68 U/L — AB (ref 0–37)
Albumin: 3.9 g/dL (ref 3.5–5.2)
Alkaline Phosphatase: 78 U/L (ref 39–117)
BUN: 7 mg/dL (ref 6–23)
CHLORIDE: 102 meq/L (ref 96–112)
CO2: 24 meq/L (ref 19–32)
CREATININE: 0.88 mg/dL (ref 0.40–1.20)
Calcium: 9.3 mg/dL (ref 8.4–10.5)
GFR: 72.44 mL/min (ref >60.00)
GLUCOSE: 122 mg/dL — AB (ref 70–99)
Potassium: 3.9 mEq/L (ref 3.5–5.1)
SODIUM: 138 meq/L (ref 135–145)
Total Bilirubin: 1 mg/dL (ref 0.2–1.2)
Total Protein: 7 g/dL (ref 6.0–8.3)

## 2016-06-29 LAB — CBC WITH DIFFERENTIAL/PLATELET
BASOS ABS: 0 10*3/uL (ref 0.0–0.1)
Basophils Relative: 0.4 % (ref 0.0–3.0)
Eosinophils Absolute: 0.1 10*3/uL (ref 0.0–0.7)
Eosinophils Relative: 0.7 % (ref 0.0–5.0)
HEMATOCRIT: 38.2 % (ref 36.0–46.0)
HEMOGLOBIN: 13 g/dL (ref 12.0–15.0)
LYMPHS PCT: 18.8 % (ref 12.0–46.0)
Lymphs Abs: 1.5 10*3/uL (ref 0.7–4.0)
MCHC: 34.1 g/dL (ref 30.0–36.0)
MCV: 93.8 fl (ref 78.0–100.0)
MONOS PCT: 7.3 % (ref 3.0–12.0)
Monocytes Absolute: 0.6 10*3/uL (ref 0.1–1.0)
NEUTROS ABS: 5.8 10*3/uL (ref 1.4–7.7)
NEUTROS PCT: 72.8 % (ref 43.0–77.0)
PLATELETS: 262 10*3/uL (ref 150.0–400.0)
RBC: 4.07 Mil/uL (ref 3.87–5.11)
RDW: 12.8 % (ref 11.5–15.5)
WBC: 8 10*3/uL (ref 4.0–10.5)

## 2016-06-29 LAB — HIGH SENSITIVITY CRP: CRP HIGH SENSITIVITY: 1.79 mg/L (ref 0.000–5.000)

## 2016-06-29 LAB — SEDIMENTATION RATE: SED RATE: 15 mm/h (ref 0–20)

## 2016-06-29 MED ORDER — PREDNISONE 10 MG PO TABS: 10.0000 mg | ORAL_TABLET | Freq: Every day | ORAL | 0 refills | Status: DC

## 2016-06-29 NOTE — Progress Notes (Signed)
     06/29/2016 Matsue Picco [id number] 1966/04/19   History of Present Illness:  Sheri Washington is a 50 year old female known to Dr. Leone Payor for treatment of her Crohn's disease.  Was previously on Cimzia but made her feel terrible.  Was last seen here 07/2015 when Humira was initiated.  She comes in today with her husband stating that she has not been on any medication since the beginning of the year when her insurance changed.  Complaining of diarrhea usually 4 or so times per day.  Some abdominal pain but diarrhea is the largest complaint.  Feels like her bottom is raw from having so many BM's and has some red blood from that but no blood in stool per se.   Current Medications, Allergies, Past Medical History, Past Surgical History, Family History and Social History were reviewed in [employer] record.   Physical Exam: BP 128/80 (BP Location: Left Arm, Patient Position: Sitting, Cuff Size: Large)   Pulse 90   Ht 5' 5.5" (1.664 m)   Wt 144 lb 6.4 oz (65.5 kg)   SpO2 98%   BMI 23.66 kg/m  General: Well developed white female in no acute distress Head: Normocephalic and atraumatic Eyes:  Sclerae anicteric, conjunctiva pink  Ears: Normal auditory acuity Lungs: Clear throughout to auscultation Heart: Regular rate and rhythm Abdomen: Soft, non-distended.  Normal bowel sounds.  Non-tender. Musculoskeletal: Symmetrical with no gross deformities  Extremities: No edema  Neurological: Alert oriented x 4, grossly non-focal Psychological:  Alert and cooperative. Normal mood and affect  Assessment and Recommendations: -Crohn's disease:  Not on medication since beginning of the year when insurance changed.  Does not feel like Humira helped the few months that she was on it.  Wants to try Remicade, which I think may be a better option for compliance reasons anyway.  Will try to get this going for dose of 5 mg/kg.  Will check labs today including CBC, CMP, sed rate, CRP, and  quantiferon gold.  Will place her on low-dose prednisone for now at 20 mg daily for 2 weeks then taper by 5 mg weekly.

## 2016-06-29 NOTE — Patient Instructions (Signed)
Please go to the basement level to have your labs drawn.   We sent a prescription for Prednisone 10 mg tablets to your pharmacy.   We have set you up with Remicade appointments at [facility] to admitting and then to Short Stay. Marland Kitchen  07-17-2016 at 8:00 am 08-01-2016 at 12:00 noon 08-29-2016  At 9:00 am

## 2016-07-03 ENCOUNTER — Telehealth: Payer: Self-pay

## 2016-07-03 LAB — QUANTIFERON TB GOLD ASSAY (BLOOD)
MITOGEN-NIL SO: 0.49 [IU]/mL
QUANTIFERON NIL VALUE: 0.02 [IU]/mL
QUANTIFERON TB AG MINUS NIL: 0 [IU]/mL

## 2016-07-03 NOTE — Telephone Encounter (Signed)
-----   Message from Libby MawAmy L Hazelwood sent at 07/03/2016 10:03 AM EDT ----- Regarding: REMICADE BENEFITS HEY SHERI, I CALLED FOR BENEFITS FOR REMICADE FOR THIS PATIENT.  SHE HAS AN HMO POLICY THAT WE ARE NOT IN NETWORK WITH.  PT HAS NO OUT OF NETWORK BENEFITS AND WOULD NOT BE COVERED. SORRY, AMY

## 2016-07-03 NOTE — Telephone Encounter (Signed)
Left message for patient to call back to discuss.

## 2016-07-04 NOTE — Telephone Encounter (Signed)
Patient notified that she has not outpatient benefits to get [place] Remicade [place] in [place]. She would have to pay all costs at 100%.   I have asked her to contact her insurance company and find out if there is an infusion center in network for her. I advised her once she has that information we can work/investigate if she is abe to get her infusions there.  She understands that I will cancel her infusions [facility] and I will wait for her to call back with infusion center information.

## 2016-07-08 NOTE — Progress Notes (Signed)
Agree with Ms. Esterwood's assessment and plan. Carl E. Gessner, MD, FACG   

## 2016-07-17 ENCOUNTER — Encounter (HOSPITAL_COMMUNITY): Payer: Managed Care, Other (non HMO)

## 2016-07-20 NOTE — Telephone Encounter (Signed)
Left Larita FifeLynn a detailed message to call me back.

## 2016-07-20 NOTE — Telephone Encounter (Signed)
Please find out if she has found a different infusion center?

## 2016-07-21 NOTE — Telephone Encounter (Signed)
Larita FifeLynn called back with the following information:  [facility] , [address], [place].  Phone # [phone].  She said they are on her insurance plan.  She is aware that Lavonna RuaSheri is out today and will be back Monday to work on getting this set up.

## 2016-07-25 NOTE — Telephone Encounter (Signed)
Referral faxed to infusion center Left message for patient to call back to discus process

## 2016-07-25 NOTE — Telephone Encounter (Signed)
Ok to set up Remicade here is approved?

## 2016-07-25 NOTE — Telephone Encounter (Signed)
Yes please do - induction and then q 8 week maintenance She should see me in November unless has an appointment already

## 2016-07-25 NOTE — Telephone Encounter (Signed)
I contacted the infusion center.  They will fax me a referral form.

## 2016-07-26 NOTE — Telephone Encounter (Signed)
Patient notified of plan.  She is aware that [facility] should [facility] to set up first 3 infusions.  She will let me know if she does not hear from them in the next week or so

## 2016-08-01 ENCOUNTER — Encounter (HOSPITAL_COMMUNITY): Payer: PRIVATE HEALTH INSURANCE

## 2016-08-29 ENCOUNTER — Encounter (HOSPITAL_COMMUNITY): Payer: PRIVATE HEALTH INSURANCE

## 2016-09-25 ENCOUNTER — Telehealth: Payer: Self-pay | Admitting: Internal Medicine

## 2016-09-25 NOTE — Telephone Encounter (Signed)
Left message for patient to call back  

## 2016-09-28 NOTE — Telephone Encounter (Signed)
Left message for patient to call back  

## 2016-10-03 NOTE — Telephone Encounter (Signed)
No return call from the patient.  Left message for patient to call back

## 2016-10-03 NOTE — Telephone Encounter (Signed)
Send her a letter that we have been trying to contact her and she needs to contact us back.  If she fails to do so anticipate dismissing from practice for non-compliance

## 2016-10-04 NOTE — Telephone Encounter (Signed)
Letter mailed

## 2017-01-15 ENCOUNTER — Other Ambulatory Visit: Payer: Self-pay

## 2017-01-15 MED ORDER — PANTOPRAZOLE SODIUM 40 MG PO TBEC: 40.0000 mg | DELAYED_RELEASE_TABLET | Freq: Every day | ORAL | 6 refills | Status: AC

## 2017-04-11 ENCOUNTER — Ambulatory Visit (INDEPENDENT_AMBULATORY_CARE_PROVIDER_SITE_OTHER): Payer: Managed Care, Other (non HMO) | Admitting: Internal Medicine

## 2017-04-11 ENCOUNTER — Other Ambulatory Visit (INDEPENDENT_AMBULATORY_CARE_PROVIDER_SITE_OTHER): Payer: Managed Care, Other (non HMO)

## 2017-04-11 ENCOUNTER — Encounter: Payer: Self-pay | Admitting: Internal Medicine

## 2017-04-11 ENCOUNTER — Encounter (INDEPENDENT_AMBULATORY_CARE_PROVIDER_SITE_OTHER): Payer: Self-pay

## 2017-04-11 VITALS — BP 100/60 | HR 80 | Ht 65.0 in | Wt 116.8 lb

## 2017-04-11 DIAGNOSIS — F1020 Alcohol dependence, uncomplicated: Secondary | ICD-10-CM

## 2017-04-11 DIAGNOSIS — R634 Abnormal weight loss: Secondary | ICD-10-CM | POA: Diagnosis not present

## 2017-04-11 DIAGNOSIS — F439 Reaction to severe stress, unspecified: Secondary | ICD-10-CM | POA: Diagnosis not present

## 2017-04-11 DIAGNOSIS — K50019 Crohn's disease of small intestine with unspecified complications: Secondary | ICD-10-CM

## 2017-04-11 LAB — COMPREHENSIVE METABOLIC PANEL
ALBUMIN: 3.9 g/dL (ref 3.5–5.2)
ALT: 37 U/L — ABNORMAL HIGH (ref 0–35)
AST: 66 U/L — AB (ref 0–37)
Alkaline Phosphatase: 141 U/L — ABNORMAL HIGH (ref 39–117)
BUN: 11 mg/dL (ref 6–23)
CALCIUM: 9 mg/dL (ref 8.4–10.5)
CHLORIDE: 92 meq/L — AB (ref 96–112)
CO2: 23 meq/L (ref 19–32)
Creatinine, Ser: 1.77 mg/dL — ABNORMAL HIGH (ref 0.40–1.20)
GFR: 32.24 mL/min — ABNORMAL LOW (ref >60.00)
Glucose, Bld: 103 mg/dL — ABNORMAL HIGH (ref 70–99)
Potassium: 3.3 mEq/L — ABNORMAL LOW (ref 3.5–5.1)
Sodium: 127 mEq/L — ABNORMAL LOW (ref 135–145)
Total Bilirubin: 0.7 mg/dL (ref 0.2–1.2)
Total Protein: 7.5 g/dL (ref 6.0–8.3)

## 2017-04-11 LAB — CBC WITH DIFFERENTIAL/PLATELET
BASOS PCT: 0.5 % (ref 0.0–3.0)
Basophils Absolute: 0.1 10*3/uL (ref 0.0–0.1)
Eosinophils Absolute: 0.1 10*3/uL (ref 0.0–0.7)
Eosinophils Relative: 0.7 % (ref 0.0–5.0)
HEMATOCRIT: 35.8 % — AB (ref 36.0–46.0)
Hemoglobin: 12 g/dL (ref 12.0–15.0)
LYMPHS PCT: 14.5 % (ref 12.0–46.0)
Lymphs Abs: 1.8 10*3/uL (ref 0.7–4.0)
MCHC: 33.6 g/dL (ref 30.0–36.0)
MCV: 96.9 fl (ref 78.0–100.0)
MONOS PCT: 5.1 % (ref 3.0–12.0)
Monocytes Absolute: 0.6 10*3/uL (ref 0.1–1.0)
NEUTROS ABS: 9.6 10*3/uL — AB (ref 1.4–7.7)
Neutrophils Relative %: 79.2 % — ABNORMAL HIGH (ref 43.0–77.0)
PLATELETS: 251 10*3/uL (ref 150.0–400.0)
RBC: 3.7 Mil/uL — ABNORMAL LOW (ref 3.87–5.11)
RDW: 12.4 % (ref 11.5–15.5)
WBC: 12.1 10*3/uL — AB (ref 4.0–10.5)

## 2017-04-11 LAB — C-REACTIVE PROTEIN: CRP: 0.7 mg/dL (ref 0.5–20.0)

## 2017-04-11 LAB — TSH: TSH: 5.83 u[IU]/mL — ABNORMAL HIGH (ref 0.35–4.50)

## 2017-04-11 LAB — VITAMIN B12: Vitamin B-12: 725 pg/mL (ref 211–911)

## 2017-04-11 MED ORDER — DIPHENOXYLATE-ATROPINE 2.5-0.025 MG PO TABS: 1.0000 | ORAL_TABLET | Freq: Four times a day (QID) | ORAL | 1 refills | Status: AC | PRN

## 2017-04-11 MED ORDER — PREDNISONE 10 MG PO TABS: 40.0000 mg | ORAL_TABLET | Freq: Every day | ORAL | 1 refills | Status: AC

## 2017-04-11 NOTE — Progress Notes (Signed)
Labs suggest dehydration as we thought She needs to stop EtOH as liver tests abnormal Please send her or read off some of the short oral rehydration recipes to take and use Await CT also

## 2017-04-11 NOTE — Progress Notes (Signed)
   Sheri Washington 51 y.o. 12/10/1965 [id number]  Assessment & Plan:   Encounter Diagnoses  Name Primary?  . Crohn's disease of small intestine with complication (HCC)   . Situational stress Yes  . Loss of weight   . Alcoholism (HCC)     Needs to stop alcohol Restart biologic agent for Crohn's disease, she said she felt bad after taking Cimzia. Humira was tolerated that was changed due to insurance. We'll review situation, in the meantime start prednisone 40 mg daily. Labs today to include CBC, CMET, TSH, C-reactive protein CT abdomen and pelvis to investigate status of disease  Follow-up in approximately one month  Subjective:   Chief Complaint: diarrhea, weight loss, Crohn's  HPI Patient is here, what has not been seen in some time, last was in August 2017. She has been off medical therapy for her Crohn's disease due to insurance issues. She is having frequent diarrhea throughout the day and at night. She is losing weight, down approximately 28 pounds since last year. She is having a stressful situation and that she is getting a divorce from her husband though she says it's amicable. She has started drinking again and is drinking 2 or 3 glasses of wine a day is what she says. She understands she should stop this. She is on her bipolar therapy but has stopped seeing her psychiatrist due to some disagreement. Having some lower abdominal cramping, and also rectal cramps. She is concerned about her weight loss she feels dehydrated. Review of Systems Wt Readings from Last 3 Encounters:  04/11/17 116 lb 12.8 oz (53 kg)  06/29/16 144 lb 6.4 oz (65.5 kg)  08/09/15 146 lb 8 oz (66.5 kg)   Past Medical History:  Diagnosis Date  . Alcoholism (HCC)   . Anemia   . Bipolar affective disorder (HCC)   . Crohn's disease of small intestine (HCC) 2011   fistulizing, abscess, ileocolonic resection  . Depression   . Foot fracture, left   . Hypothyroidism   . Kidney stone   . Vitamin  D deficiency    Past Surgical History:  Procedure Laterality Date  . COLONOSCOPY  08/23/2010, 2014   microscopic ileitis - Crohn's  . FOOT SURGERY    . HEMICOLECTOMY  06/17/10   Ileocecectomy w/ anastomosis and seperate small bowel resection w/ anastomosis  . INTRAUTERINE DEVICE INSERTION    . TONSILLECTOMY     Current Meds  Medication Sig  . BIOTIN PO Take 1 capsule by mouth daily.   . Cholecalciferol (VITAMIN D) 2000 UNITS CAPS Take 1 capsule by mouth daily.  Marland Kitchen. lamoTRIgine (LAMICTAL) 25 MG tablet Take 100 mg by mouth daily.   . pantoprazole (PROTONIX) 40 MG tablet Take 1 tablet (40 mg total) by mouth daily before breakfast.  . sertraline (ZOLOFT) 100 MG tablet Take 100 mg by mouth daily.    No Known Allergies  Social hx and Family hx reviewed in EMR   Objective:   Physical Exam BP 100/60   Pulse 80   Ht 5\' 5"  (1.651 m)   Wt 116 lb 12.8 oz (53 kg)   BMI 19.44 kg/m  Mildly asthenic Eyes anicteric Lungs cta Cor S1s2 no rmg abd thin soft mildly tender diffusely Rectal - June McMurray CMA NL anoderm No mass nontender

## 2017-04-11 NOTE — Assessment & Plan Note (Signed)
Flare CT Labs Prednisone Lomotil

## 2017-04-11 NOTE — Patient Instructions (Signed)
Your physician has requested that you go to the basement for the lab work before leaving today.   We have sent the following medications to your pharmacy for you to pick up at your convenience: Prednisone, Lomotil   Follow up with Dr Carlean Purl on 05/22/17 at 2:15PM.   Please stop alcohol.   You have been scheduled for a CT scan of the abdomen and pelvis at [facility] in [place] are scheduled on 04/27/17 at 4:00pm. You should arrive 15 minutes prior to your appointment time for registration. Please follow the written instructions below on the day of your exam:  WARNING: IF YOU ARE ALLERGIC TO IODINE/X-RAY DYE, PLEASE NOTIFY RADIOLOGY IMMEDIATELY AT [phone]! YOU WILL BE GIVEN A 13 HOUR PREMEDICATION PREP.  1) Do not eat or drink anything after 12:00pm (4 hours prior to your test) 2) You have been given 2 bottles of oral contrast to drink. The solution may taste  better if refrigerated, but do NOT add ice or any other liquid to this solution. Shake  well before drinking.    Drink 1 bottle of contrast @ 2:00pm (2 hours prior to your exam)  Drink 1 bottle of contrast @ 3:00pm (1 hour prior to your exam)  You may take any medications as prescribed with a small amount of water except for the following: Metformin, Glucophage, Glucovance, Avandamet, Riomet, Fortamet, Actoplus Met, Janumet, Glumetza or Metaglip. The above medications must be held the day of the exam AND 48 hours after the exam.  The purpose of you drinking the oral contrast is to aid in the visualization of your intestinal tract. The contrast solution may cause some diarrhea. Before your exam is started, you will be given a small amount of fluid to drink. Depending on your individual set of symptoms, you may also receive an intravenous injection of x-ray contrast/dye. Plan on being at [facility] for 30 minutes or longer, depending on the type of exam you are having performed.  This test typically takes 30-45  minutes to complete.  If you have any questions regarding your exam or if you need to reschedule, you may call the CT department at [phone] between the hours of 8:00 am and 5:00 pm, Monday-Friday.  ________________________________________________________________________  I appreciate the opportunity to care for you. Silvano Rusk, MD, [facility]

## 2017-04-12 LAB — HEPATITIS B SURFACE ANTIGEN: Hepatitis B Surface Ag: NEGATIVE

## 2017-04-13 ENCOUNTER — Telehealth: Payer: Self-pay | Admitting: Internal Medicine

## 2017-04-13 LAB — QUANTIFERON TB GOLD ASSAY (BLOOD)
Interferon Gamma Release Assay: NEGATIVE
Mitogen-Nil: 0.72 IU/mL
Quantiferon Nil Value: 0.05 IU/mL
Quantiferon Tb Ag Minus Nil Value: 0 IU/mL

## 2017-04-13 MED ORDER — ONDANSETRON HCL 4 MG PO TABS: ORAL_TABLET | ORAL | 1 refills | Status: AC

## 2017-04-13 NOTE — Telephone Encounter (Signed)
Patient is asking for something for nausea . Please advise

## 2017-04-13 NOTE — Telephone Encounter (Signed)
rx sent. Patient notified via voicemail 

## 2017-04-13 NOTE — Telephone Encounter (Signed)
Ondansetron 4 mg 1-2 every 6 hrs prn # 30 1 RF

## 2017-04-13 NOTE — Telephone Encounter (Signed)
pt states she is calling Sheri back to discuss voicemail and recipes

## 2017-04-25 NOTE — Progress Notes (Signed)
She is ok for restarting a biologic Can she move her CT scan date sooner though? [facility]

## 2017-04-26 ENCOUNTER — Encounter (HOSPITAL_COMMUNITY): Payer: Self-pay

## 2017-04-26 ENCOUNTER — Ambulatory Visit (HOSPITAL_COMMUNITY)
Admission: RE | Admit: 2017-04-26 | Discharge: 2017-04-26 | Disposition: A | Discharge status: Home / Self Care | Payer: Managed Care, Other (non HMO) | Source: Ambulatory Visit | Attending: Internal Medicine | Admitting: Internal Medicine

## 2017-04-26 DIAGNOSIS — K50019 Crohn's disease of small intestine with unspecified complications: Secondary | ICD-10-CM | POA: Insufficient documentation

## 2017-04-26 DIAGNOSIS — R188 Other ascites: Secondary | ICD-10-CM | POA: Insufficient documentation

## 2017-04-26 DIAGNOSIS — I700 Atherosclerosis of aorta: Secondary | ICD-10-CM | POA: Insufficient documentation

## 2017-04-26 DIAGNOSIS — K760 Fatty (change of) liver, not elsewhere classified: Secondary | ICD-10-CM | POA: Diagnosis not present

## 2017-04-26 DIAGNOSIS — R160 Hepatomegaly, not elsewhere classified: Secondary | ICD-10-CM | POA: Insufficient documentation

## 2017-04-26 MED ORDER — IOPAMIDOL (ISOVUE-300) INJECTION 61%
75.0000 mL | Freq: Once | INTRAVENOUS | Status: AC | PRN
  Administered 2017-04-26: 75 mL via INTRAVENOUS

## 2017-04-27 ENCOUNTER — Ambulatory Visit (HOSPITAL_COMMUNITY): Admission: RE | Admit: 2017-04-27 | Payer: Managed Care, Other (non HMO) | Source: Ambulatory Visit

## 2017-04-30 NOTE — Progress Notes (Signed)
CT shows fatty liver from EtOH Intestines ok  Hope she is better - please get a sx update re: diarrhea, pain and is she off EtOH?

## 2017-05-01 NOTE — Progress Notes (Signed)
OK reduce prednisone to 30 mg x 1 week then 20 mg until sees me

## 2017-05-02 ENCOUNTER — Ambulatory Visit: Payer: Managed Care, Other (non HMO) | Admitting: Internal Medicine

## 2017-05-22 ENCOUNTER — Ambulatory Visit: Payer: Managed Care, Other (non HMO) | Admitting: Internal Medicine
